# Patient Record
Sex: Male | Born: 1950 | Race: White | Hispanic: No | Marital: Married | Smoking: Never smoker
Health system: Southern US, Community
[De-identification: ages and names within clinical notes are randomized; demographics above are authoritative.]

## PROBLEM LIST (undated history)

## (undated) DIAGNOSIS — N200 Calculus of kidney: Secondary | ICD-10-CM

## (undated) DIAGNOSIS — Z86010 Personal history of colonic polyps: Secondary | ICD-10-CM

## (undated) DIAGNOSIS — C61 Malignant neoplasm of prostate: Secondary | ICD-10-CM

## (undated) DIAGNOSIS — R17 Unspecified jaundice: Secondary | ICD-10-CM

## (undated) DIAGNOSIS — G4730 Sleep apnea, unspecified: Secondary | ICD-10-CM

## (undated) DIAGNOSIS — Z87442 Personal history of urinary calculi: Secondary | ICD-10-CM

## (undated) DIAGNOSIS — R05 Cough: Secondary | ICD-10-CM

## (undated) DIAGNOSIS — R059 Cough, unspecified: Secondary | ICD-10-CM

## (undated) DIAGNOSIS — C439 Malignant melanoma of skin, unspecified: Secondary | ICD-10-CM

## (undated) DIAGNOSIS — G4733 Obstructive sleep apnea (adult) (pediatric): Secondary | ICD-10-CM

## (undated) DIAGNOSIS — E785 Hyperlipidemia, unspecified: Secondary | ICD-10-CM

## (undated) DIAGNOSIS — R5383 Other fatigue: Secondary | ICD-10-CM

## (undated) HISTORY — DX: Cough: R05

## (undated) HISTORY — DX: Malignant neoplasm of prostate: C61

## (undated) HISTORY — PX: LITHOTRIPSY: SUR834

## (undated) HISTORY — DX: Obstructive sleep apnea (adult) (pediatric): G47.33

## (undated) HISTORY — DX: Cough, unspecified: R05.9

## (undated) HISTORY — PX: APPENDECTOMY: SHX54

## (undated) HISTORY — DX: Hyperlipidemia, unspecified: E78.5

## (undated) HISTORY — PX: HAND SURGERY: SHX662

## (undated) HISTORY — DX: Unspecified jaundice: R17

## (undated) HISTORY — DX: Malignant melanoma of skin, unspecified: C43.9

## (undated) HISTORY — DX: Sleep apnea, unspecified: G47.30

## (undated) HISTORY — DX: Other fatigue: R53.83

## (undated) HISTORY — DX: Personal history of colonic polyps: Z86.010

---

## 1998-06-25 ENCOUNTER — Inpatient Hospital Stay (HOSPITAL_COMMUNITY): Admission: EM | Admit: 1998-06-25 | Discharge: 1998-06-27 | Payer: Self-pay | Admitting: Emergency Medicine

## 1998-06-25 ENCOUNTER — Encounter: Payer: Self-pay | Admitting: Emergency Medicine

## 2005-10-01 ENCOUNTER — Inpatient Hospital Stay (HOSPITAL_COMMUNITY): Admission: EM | Admit: 2005-10-01 | Discharge: 2005-10-03 | Payer: Self-pay | Admitting: Emergency Medicine

## 2007-07-29 ENCOUNTER — Ambulatory Visit (HOSPITAL_BASED_OUTPATIENT_CLINIC_OR_DEPARTMENT_OTHER): Admission: RE | Admit: 2007-07-29 | Discharge: 2007-07-29 | Payer: Self-pay | Admitting: Family Medicine

## 2007-08-07 ENCOUNTER — Ambulatory Visit: Payer: Self-pay | Admitting: Internal Medicine

## 2007-09-22 ENCOUNTER — Ambulatory Visit: Payer: Self-pay | Admitting: Internal Medicine

## 2007-09-26 ENCOUNTER — Ambulatory Visit (HOSPITAL_BASED_OUTPATIENT_CLINIC_OR_DEPARTMENT_OTHER): Admission: RE | Admit: 2007-09-26 | Discharge: 2007-09-26 | Payer: Self-pay | Admitting: Family Medicine

## 2007-10-02 ENCOUNTER — Ambulatory Visit: Payer: Self-pay | Admitting: Internal Medicine

## 2007-10-19 ENCOUNTER — Ambulatory Visit: Payer: Self-pay | Admitting: Internal Medicine

## 2007-10-19 HISTORY — PX: COLONOSCOPY: SHX174

## 2007-10-21 ENCOUNTER — Ambulatory Visit (HOSPITAL_COMMUNITY): Admission: RE | Admit: 2007-10-21 | Discharge: 2007-10-21 | Payer: Self-pay | Admitting: Urology

## 2007-10-22 ENCOUNTER — Emergency Department (HOSPITAL_COMMUNITY): Admission: EM | Admit: 2007-10-22 | Discharge: 2007-10-23 | Payer: Self-pay | Admitting: Emergency Medicine

## 2010-06-04 NOTE — Procedures (Signed)
NAME:  Dennis, Villanueva NO.:  [id number]   MEDICAL RECORD NO.:  [id number]          PATIENT TYPE:  OUT   LOCATION:  SLEEP CENTER                 FACILITY:  [facility] [place]   PHYSICIAN:  Clinton D. Maple Hudson, MD, FCCP, FACPDATE OF BIRTH:  03/19/49   DATE OF STUDY:  07/29/2007                            NOCTURNAL POLYSOMNOGRAM   REFERRING PHYSICIAN:  Tammy R. Collins Scotland, M.D.   INDICATION FOR STUDY:  Hypersomnia with sleep apnea.   EPWORTH SLEEPINESS SCORE:  14/24, BMI 29.5, weight 230 pounds, height 74  inches, neck 18 inches.   HOME MEDICATIONS:  Charted and reviewed.   SLEEP ARCHITECTURE:  Total sleep time 378 minutes with sleep efficiency  90.4%.  Stage 1 was 6.3%, stage 2 was 64.7%, stage 3 was absent, REM  28.9% of total sleep time.  Sleep latency 18 minutes, REM latency 63.5  minutes, awake after sleep onset 21.5 minutes.  Arousal index 9.8.  No  bedtime medication reported.   RESPIRATORY DATA:  Apnea/hypopnea index (AHI), 10.1 per hour, 64 events  scored, including 2 obstructive apnea, 1 central apnea and 61 hypopneas.  Events were seen in all sleep positions, somewhat more common while  supine.  REM AHI was 13.7.  There were not enough events in the  available time to quality for CPAP titration by split protocol on this  study night.   OXYGEN DATA:  Moderate snoring with oxygen desaturation to a Nadir of  84%.  Mean oxygen saturation through the study was 93.8% on room air.  A  total of 1.5 minutes was spent with oxygen saturation less than 88%.   CARDIAC DATA:  Sinus rhythm with PVCs.   MOVEMENT/PARASOMNIA:  Frequent limb movement, but little related to  arousal or awakening.  Movement index 37.3.  Bathroom x1.   IMPRESSIONS/RECOMMENDATIONS:  1. Mild obstructive sleep apnea/hypopnea syndrome.  The apnea/hypopnea      index 10.1 per hour.  The events somewhat more common while supine.      Moderate snoring with oxygen desaturation to a Nadir of 84%.  2. There were  insufficient early events, to allow time for CPAP      titration by split protocol on this study night.  Scores in this      range may be addressed with CPAP if conservative measures such as      weight loss, and encouragement to sleep off of flat of back are      insufficient.  Consider return for CPAP titration or evaluate for      alternative therapies as appropriate.  3. Frequent limb movement, most not related to identifiable arousals.      If there is clinical history suggesting      movement-related sleep disturbance at home, then consider directed      therapies such as Requip or Mirapex if clinically appropriate.      Clinton D. Maple Hudson, MD, [facility], FACP  Diplomate, Biomedical engineer of Sleep Medicine  Electronically Signed     CDY/MEDQ  D:  08/07/2007 11:47:45  T:  08/07/2007 12:21:41  Job:  [id number]

## 2010-06-04 NOTE — Procedures (Signed)
NAME:  DEMAR, SHAD NO.:  [id number]   MEDICAL RECORD NO.:  [id number]          PATIENT TYPE:  OUT   LOCATION:  SLEEP CENTER                 [facility]ACILITY:  [facility]   PHYSICIAN:  Clinton D. Maple Hudson, MD, FCCP, FACPDATE OF BIRTH:  02-Mar-1949   DATE OF STUDY:  09/26/2007                            NOCTURNAL POLYSOMNOGRAM   REFERRING PHYSICIAN:  Tammy R. Collins Scotland, M.D.   INDICATION FOR STUDY:  Insomnia with sleep apnea.   EPWORTH SLEEPINESS SCORE:  14/24, BMI 30.2, weight 235 pounds, height 74  inches, neck 18 inches.   MEDICATIONS:  Home medication charted and reviewed.   A baseline diagnostic study on July 29, 2007, revealed an AHI of 10.1  per hour.  CPAP titration is requested.   SLEEP ARCHITECTURE:  Total sleep time 369 minutes with sleep efficiency  90.9%.  Sleep latency 18 minutes.  REM latency 53 minutes.  Awake after  sleep onset 18.5 minutes.  Arousal index 6.0.  Bedtime medication  included Metamucil and Rolaids.   RESPIRATORY DATA:  CPAP titration protocol.  CPAP was titrated to 12  CWP, AHI 0.9 per hour.  He chose a medium ResMed Quattro full face mask  with heated humidifier.   OXYGEN DATA:  Snoring was prevented by CPAP with mean oxygen saturation  through the study 93.9% on room air.   CARDIAC DATA:  Sinus rhythm with rare PVC.   MOVEMENT/PARASOMNIA:  Frequent REM jerks, rarely affecting sleep and  probably insignificant.  Bathroom times one.   IMPRESSIONS-RECOMMENDATIONS:  1. Successful CPAP titration to 12 CWP, AHI 0.9 per hour.  He chose a      medium ResMed Quattro full face mask with heated humidifier.  2. Baseline diagnostic NVSG on July 29, 2007, had revealed an AHI of      10.1 per hour.  3. Limb jerks were noted as described above.  Periodic limb movement      with arousal index was only 0.3 per hour, which is insignificant.      Limb jerks were commented upon at his last study.  His status, once      fitted with home      CPAP, may help  determine whether he would benefit from specific      therapy, such as Requip or Mirapex, if clinically appropriate.      Clinton D. Maple Hudson, MD, [facility], FACP  Diplomate, Biomedical engineer of Sleep Medicine  Electronically Signed     CDY/MEDQ  D:  10/02/2007 09:45:52  T:  10/02/2007 10:19:20  Job:  [id number]

## 2010-06-07 NOTE — Op Note (Signed)
NAME:  Dennis Villanueva, Dennis Villanueva NO.:  [id number]   MEDICAL RECORD NO.:  [id number]          PATIENT TYPE:  INP   LOCATION:  2550                         FACILITY:  [facility]   PHYSICIAN:  Dionne Ano. Gramig III, M.D.DATE OF BIRTH:  10-16-1950   DATE OF PROCEDURE:  09/30/2005  DATE OF DISCHARGE:                                 OPERATIVE REPORT   PREOPERATIVE DIAGNOSIS:  Purulent flexor tenosynovitis, right index finger,  secondary to cat bite.   POSTOPERATIVE DIAGNOSIS:  Purulent flexor tenosynovitis, right index finger,  secondary to cat bite.   PROCEDURE:  Incision and drainage purulent flexor tenosynovitis, right hand  about the flexor sheath region and incision and drainage of a separate  dorsal wound significant for abscess about the dorsal upper aspect of the  hand.   SURGEON:  Dionne Ano. Amanda Pea, M.D.   ASSISTANT:  None.   COMPLICATIONS:  None.   ANESTHESIA:  General.   TOURNIQUET TIME:  Less than an hour.   DRAINS:  Two.   INDICATIONS FOR THE PROCEDURE:  This patient is a 60 year old male who  presents status post cat bite to the right hand 1 day ago.  He has had 2  doses of Augmentin.  He had worsening signs and symptoms with ascending  cellulitis, lymphangitis and marked areas of abnormality about the dorsum  and volar wounds with cat bites were noted.  The patient did not have a  fracture on x-ray.  He did not have obvious instability.  He does have early  Kanavel's sign.  I have discussed with the patient the issues at length.  Discussed the risks and benefits  __________  operative intervention.   OPERATION IN DETAIL:  Patient __________  general anesthesia, laid supine,  prepped and draped in sterile fashion. Betadine scrub and painted about the  right upper extremity. Following this, the patient had I&D of the dorsal  abscess.  Dorsal abscess was I&D through the skin and subcutaneous tissue,  meticulously removed in terms of any prenecrotic areas.   Following this, I  irrigated copiously with fluids and packed the area.  Once this was done,  volar wound was explored.  Incision was made, chevron in nature.  Dissection  was carried down, immediate purulence was encountered.  This was cultured  for aerobic and anaerobic culture.  Following this, the patient then  underwent identification of the radial digital nerve which was intact.  I  then identified the flexor sheath, opened the A1 pulley region and noted  purulence.  With this noted, I then performed a meticulous tenosynovectomy  and irrigation was performed in this region.  He did not have significant  abnormality distally.  There was no excessive tracking.  Following this, I  then irrigated copiously with greater than 2 liters of saline.  Once this  was done, drains were placed.  The wound was closed with 1 loose suture and  we will plan for repeat I&D in 48 hours as well as continued IV antibiotics  in the form of Unasyn.  I have discussed with the patient these issues.  At  the  conclusion of the case, I irrigated once again with the tourniquet down,  making sure he had excellent refill and placement of volar plaster splint  after a sterile dressing was applied.   He will be admitted for IV antibiotics and pain medicine according to his  needs and general postoperative observation.  All questions have been  encouraged and answered.           ______________________________  Dionne Ano. Everlene Other, M.D.     Nash Mantis  D:  10/01/2005  T:  10/01/2005  Job:  [id number]

## 2010-06-07 NOTE — Discharge Summary (Signed)
NAME:  Dennis Villanueva, Dennis Villanueva NO.:  [id number]   MEDICAL RECORD NO.:  [id number]          PATIENT TYPE:  INP   LOCATION:  5159                         FACILITY:  [facility]   PHYSICIAN:  Dionne Ano. Gramig, M.D.DATE OF BIRTH:  07/30/1950   DATE OF ADMISSION:  09/30/2005  DATE OF DISCHARGE:  10/03/2005                                 DISCHARGE SUMMARY   DATE OF ADMISSION:  September 30, 2005   DATE OF DISCHARGE:  October 03, 2005   ADMITTING DIAGNOSES:  1. Right hand index finger purulent flexor tenosynovitis secondary to cat      bite.  2. History of seasonal allergies.   SURGEON:  Dionne Ano. Amanda Pea, M.D.   CONSULTS:  None.   BRIEF HISTORY OF THE PRESENT ILLNESS:  Dennis Villanueva is a very pleasant 60-  year-old gentleman who presented to the emergency room setting on September 30, 2005, secondary to worsening pain and soft tissue swelling about the  right hand, secondary to a prior cat bite.  He was previously seen and  evaluated by Dr. Herb Grays, where he was placed on Augmentin, but  unfortunately had worsening.  He presents to the emergency room for further  evaluation and is seen and evaluated by hand and upper extremity surgeon,  Dionne Ano. Gramig.  He was noted to have a significant amount of erythema  about the palmar aspect of his hand with both dorsal and volar soft tissue  swelling.  He was noted to have positive Kanavel signs with obvious infected  process.  Given the nature of his upper extremity predicament, the decision  was made to proceed with immediate operative intervention.  Preoperative  labs, chest x-ray, and EKG were satisfactory for proceeding with an I&D as  necessary.   HOSPITAL COURSE:  Dennis Villanueva was admitted on September 30, 2005, taken to  the operative suite secondary to a purulent flexor tenosynovitis about the  right hand index finger secondary to cat bite.  He underwent I&D without  difficulties.  Intraoperative cultures were taken.   He was admitted to [facility] with standard postoperative orders, including pain management,  elevation, and IV antibiotics in the form of Unasyn.  The patient did very  well postoperatively.  There was no complications.  Plans were made for a  repeat I&D the following day.  He was taken back to the operative suite and  a repeat I&D was performed with less purulence noted and improvement overall  in his soft tissue condition.  He continued diligent wound care, range of  motion endeavors with therapy, and over the course of the next day, his soft  tissue swelling, erythema, and pain had greatly improved.  Cultures were  negative to date, most likely due to his acute findings, as he was  previously placed on antibiotics prior to his operative intervention.  Given  his overall improvement, decision made to discharge him home.   ASSESSMENT/FINAL DIAGNOSES:  1. Status post incision and drainage x2 secondary to purulent flexor      tenosynovitis, secondary to a cat bite about the right hand and  index      finger.  2. History of seasonal allergies.   CONDITION ON DISCHARGE:  Improved.   ACTIVITY:  He will keep his dressing clean, dry, and intact.  We have  discussed wound care.  He will return Monday at 11 a.m. to the office.  We  will plan for diligent wound care, range of motion, elevation.   DISCHARGE MEDICATIONS:  Include:  1. Augmentin 875 one p.o. b.i.d.  2. Vicodin 1-2 p.o. q.4-6 p.r.n.   He will call [phone] for any questions or concerns.      Karie Chimera, P.A.-C.    ______________________________  Dionne Ano. Amanda Pea, M.D.    BB/MEDQ  D:  10/21/2005  T:  10/21/2005  Job:  [id number]

## 2010-06-07 NOTE — Op Note (Signed)
NAME:  Dennis Villanueva, Dennis Villanueva NO.:  [id number]   MEDICAL RECORD NO.:  [id number]          PATIENT TYPE:  INP   LOCATION:  5159                         FACILITY:  [facility]   PHYSICIAN:  Dionne Ano. Gramig III, M.D.DATE OF BIRTH:  [birth year]   DATE OF PROCEDURE:  DATE OF DISCHARGE:                                 OPERATIVE REPORT   PREOPERATIVE DIAGNOSIS:  Right hand and index finger virulent flexor  tenosynovitis.   POSTOPERATIVE DIAGNOSES:  Right hand and index finger virulent flexor  tenosynovitis.   PROCEDURES:  1. Incision and debridement of virulent flexor tenosynovitis, plus deep      abscess, right index finger and hand.  2. Extensive radical tenosynovectomy of the flexor tendon apparatus with      flexor tendon debridement, right index finger.   SURGEON:  Dionne Ano. Amanda Pea, M.D.   ASSISTANT:  Karie Chimera, P.A.-C.   COMPLICATIONS:  None.   ANESTHESIA:  General.   TOURNIQUET TIME:  Zero.   ESTIMATED BLOOD LOSS:  Less than 30 cc.   INDICATIONS FOR PROCEDURE:  This patient is a 60 year old male, who presents  after a cat bite with virulent flexor tenosynovitis.  He understands and  accepts the risks and benefits of surgery.  This is his second washout.  He  is improved in terms of his wound characteristics, but is not completely at  quiescence at the present time.  I have discussed with him the risks and  benefits of the procedure, alternatives, etc., and all questions have been  encouraged and answered.   OPERATIVE PROCEDURE IN DETAIL:  The patient was identified by myself and  Anesthesia, taken to the operating suite.  In the operating suite, he  underwent a general anesthetic under the direction of Dr. Hart Robinsons.  Following this, the patient had his prior drains removed, and he was prepped  and draped with Betadine Scrub and Paint as necessary.  Once a sterile field  was secured, I then performed an I&D of the deep abscess.  The skin and  subcutaneous tissue were I&D'd aggressively, and the deep portions of the  wound had 3 L placed in them.  Following this, I then performed a radical  tenosynovectomy with release of a portion of the A1 and A2 pulleys to  prevent tendon friction.  Overall, the wound conditions looked improved, but  he did have marked fraying of the FDS tendon, which had to be debrided.  Thus, tenosynovectomy and debridement occurred without difficulty.  The  tendons were intact.  Following this, I then irrigated again with an  additional liter, followed by placement of a TLS drain x2 in the wound and  loose closure with Prolene suture.  He tolerated this well.  Xeroform was  placed, followed by gauze and a volar plaster splint.  He tolerated the  procedure well, and all sponge, needle and instrument counts were reported  as correct.  He will be monitored in the recovery room and will continue as  an inpatient on Unasyn.  We will await his cultures.   I have discussed with him and his wife  specifically the very aggressive  nature of the infection and the fact that this is very serious in terms of  the health of the finger in general, and complications associated with these  types of infections.  I have tried to give them realistic expectations,  etc., and have discussed this at great length with the patient's wife over  the phone, and with the patient at bedside previously.  We will await the  cultures, continue aggressive care for this extremely significant infection.  All questions have been encouraged and answered.           ______________________________  Dionne Ano. Everlene Other, M.D.     Nash Mantis  D:  10/02/2005  T:  10/02/2005  Job:  [id number]

## 2010-10-22 LAB — URINALYSIS, ROUTINE W REFLEX MICROSCOPIC
Bilirubin Urine: NEGATIVE
Glucose, UA: NEGATIVE
Ketones, ur: 15 — AB
Nitrite: NEGATIVE
Protein, ur: NEGATIVE
Specific Gravity, Urine: 1.02
Urobilinogen, UA: 0.2
pH: 5.5

## 2010-10-22 LAB — URINE MICROSCOPIC-ADD ON

## 2011-04-07 ENCOUNTER — Encounter: Payer: Self-pay | Admitting: Pulmonary Disease

## 2011-04-08 ENCOUNTER — Ambulatory Visit (INDEPENDENT_AMBULATORY_CARE_PROVIDER_SITE_OTHER): Payer: 59 | Admitting: Pulmonary Disease

## 2011-04-08 ENCOUNTER — Encounter: Payer: Self-pay | Admitting: Pulmonary Disease

## 2011-04-08 VITALS — BP 128/76 | HR 60 | Temp 97.9°F | Ht 74.0 in | Wt 251.8 lb

## 2011-04-08 DIAGNOSIS — R911 Solitary pulmonary nodule: Secondary | ICD-10-CM

## 2011-04-08 NOTE — Progress Notes (Signed)
  Subjective:    Patient ID: Dennis Villanueva, male    DOB: December 10, 1950, 61 y.o.   MRN: [id number]  HPI The patient is a 61 year old male who I've been asked to see for a pulmonary nodule.  He recently developed cold-like symptoms that persisted, along with a dry hacking cough.  He had a chest x-ray done which showed a left basilar opacity, and right lower lobe atelectasis.  He was treated with antibiotics on 2 different occasions for presumed pneumonia, and feels much improved.  A followup chest x-ray showed improvement, but some persistent density in the left base, and therefore he underwent a CT scan of the chest.  This showed minimal ground glass density deep in both lung bases, as well as mild scarring.  He was also noted to have a 4 mm nodule in his right middle lobe with non-specific characteristics.  There was no mediastinal or hilar lymphadenopathy.  The patient has never smoked, and has no personal history of cancer.  He has never lived in [place] or [place], and has no history of tuberculosis exposure.  However, he has never had a PPD placed.  He is eating well, and has not been losing weight.  Again, he feels that he is getting back to his usual baseline.   Review of Systems  Constitutional: Negative for fever and unexpected weight change.  HENT: Positive for congestion and trouble swallowing. Negative for ear pain, nosebleeds, sore throat, rhinorrhea, sneezing, dental problem, postnasal drip and sinus pressure.   Eyes: Negative for redness and itching.  Respiratory: Positive for cough. Negative for chest tightness, shortness of breath and wheezing.   Cardiovascular: Negative for palpitations and leg swelling.  Gastrointestinal: Negative for nausea and vomiting.  Genitourinary: Negative for dysuria.  Musculoskeletal: Negative for joint swelling.  Skin: Negative for rash.  Neurological: Negative for headaches.  Hematological: Does not bruise/bleed easily.  Psychiatric/Behavioral:  Negative for dysphoric mood. The patient is not nervous/anxious.        Objective:   Physical Exam Constitutional:  Well developed, no acute distress  HENT:  Nares patent without discharge  Oropharynx without exudate, palate and uvula are normal  Eyes:  Perrla, eomi, no scleral icterus  Neck:  No JVD, no TMG  Cardiovascular:  Normal rate,slightly irreg rhythm, no rubs or gallops.  No murmurs        Intact distal pulses  Pulmonary :  Normal breath sounds, no stridor or respiratory distress   No rales, rhonchi, or wheezing  Abdominal:  Soft, nondistended, bowel sounds present.  No tenderness noted.   Musculoskeletal:  No lower extremity edema noted.  Lymph Nodes:  No cervical lymphadenopathy noted  Skin:  No cyanosis noted  Neurologic:  Alert, appropriate, moves all 4 extremities without obvious deficit.         Assessment & Plan:

## 2011-04-08 NOTE — Patient Instructions (Signed)
You are at very low risk for cancer with your history and small size of your nodule.  Standard criteria would recommend a followup scan at one year to re-evaluate.  Please call us in FEB 2014 to schedule scan for march.   Please call if your condition changes before your scan next year.

## 2011-04-08 NOTE — Assessment & Plan Note (Signed)
The patient has a 4 mm right middle lobe nodule noted on a recent CT chest, and is considered to be in a very low risk group overall.  By Fleischner criteria, he would either have no followup vs a one year followup at the most.  After a discussion, the pt and I have agreed to follow this up one more time in one year.  He is to call us to schedule this.  I am not overly concerned about his minimal GG changes in the lower lobes, given his history and returning to his normal baseline.

## 2011-10-03 ENCOUNTER — Other Ambulatory Visit: Payer: Self-pay | Admitting: Physician Assistant

## 2011-10-03 DIAGNOSIS — R1310 Dysphagia, unspecified: Secondary | ICD-10-CM

## 2011-10-08 ENCOUNTER — Ambulatory Visit
Admission: RE | Admit: 2011-10-08 | Discharge: 2011-10-08 | Disposition: A | Discharge status: Home / Self Care | Payer: BC Managed Care – PPO | Source: Ambulatory Visit | Attending: Physician Assistant | Admitting: Physician Assistant

## 2011-10-08 DIAGNOSIS — R1310 Dysphagia, unspecified: Secondary | ICD-10-CM

## 2012-09-22 ENCOUNTER — Encounter: Payer: Self-pay | Admitting: Internal Medicine

## 2012-09-22 DIAGNOSIS — Z860100 Personal history of colon polyps, unspecified: Secondary | ICD-10-CM | POA: Insufficient documentation

## 2012-09-22 HISTORY — DX: Personal history of colonic polyps: Z86.010

## 2012-09-30 ENCOUNTER — Encounter: Payer: Self-pay | Admitting: Internal Medicine

## 2014-05-27 ENCOUNTER — Encounter (HOSPITAL_COMMUNITY): Payer: Self-pay | Admitting: *Deleted

## 2014-05-27 ENCOUNTER — Emergency Department (HOSPITAL_COMMUNITY): Payer: PPO

## 2014-05-27 ENCOUNTER — Emergency Department (HOSPITAL_COMMUNITY)
Admission: EM | Admit: 2014-05-27 | Discharge: 2014-05-27 | Disposition: A | Discharge status: Home / Self Care | Payer: PPO | Attending: Emergency Medicine | Admitting: Emergency Medicine

## 2014-05-27 DIAGNOSIS — Z9981 Dependence on supplemental oxygen: Secondary | ICD-10-CM | POA: Diagnosis not present

## 2014-05-27 DIAGNOSIS — Z79899 Other long term (current) drug therapy: Secondary | ICD-10-CM | POA: Insufficient documentation

## 2014-05-27 DIAGNOSIS — R630 Anorexia: Secondary | ICD-10-CM | POA: Insufficient documentation

## 2014-05-27 DIAGNOSIS — Z8639 Personal history of other endocrine, nutritional and metabolic disease: Secondary | ICD-10-CM | POA: Insufficient documentation

## 2014-05-27 DIAGNOSIS — N201 Calculus of ureter: Secondary | ICD-10-CM | POA: Diagnosis not present

## 2014-05-27 DIAGNOSIS — R1030 Lower abdominal pain, unspecified: Secondary | ICD-10-CM | POA: Diagnosis present

## 2014-05-27 DIAGNOSIS — Z86010 Personal history of colonic polyps: Secondary | ICD-10-CM | POA: Insufficient documentation

## 2014-05-27 DIAGNOSIS — Z7982 Long term (current) use of aspirin: Secondary | ICD-10-CM | POA: Insufficient documentation

## 2014-05-27 DIAGNOSIS — G4733 Obstructive sleep apnea (adult) (pediatric): Secondary | ICD-10-CM | POA: Insufficient documentation

## 2014-05-27 LAB — URINALYSIS, ROUTINE W REFLEX MICROSCOPIC
Bilirubin Urine: NEGATIVE
Glucose, UA: NEGATIVE mg/dL
Ketones, ur: NEGATIVE mg/dL
LEUKOCYTES UA: NEGATIVE
NITRITE: NEGATIVE
PH: 5 (ref 5.0–8.0)
Protein, ur: NEGATIVE mg/dL
SPECIFIC GRAVITY, URINE: 1.023 (ref 1.005–1.030)
UROBILINOGEN UA: 0.2 mg/dL (ref 0.0–1.0)

## 2014-05-27 LAB — CBC WITH DIFFERENTIAL/PLATELET
BASOS ABS: 0 10*3/uL (ref 0.0–0.1)
BASOS PCT: 0 % (ref 0–1)
Eosinophils Absolute: 0.1 10*3/uL (ref 0.0–0.7)
Eosinophils Relative: 1 % (ref 0–5)
HCT: 45.8 % (ref 39.0–52.0)
Hemoglobin: 15.6 g/dL (ref 13.0–17.0)
Lymphocytes Relative: 7 % — ABNORMAL LOW (ref 12–46)
Lymphs Abs: 0.7 10*3/uL (ref 0.7–4.0)
MCH: 32.1 pg (ref 26.0–34.0)
MCHC: 34.1 g/dL (ref 30.0–36.0)
MCV: 94.2 fL (ref 78.0–100.0)
Monocytes Absolute: 0.5 10*3/uL (ref 0.1–1.0)
Monocytes Relative: 6 % (ref 3–12)
NEUTROS ABS: 7.9 10*3/uL — AB (ref 1.7–7.7)
NEUTROS PCT: 86 % — AB (ref 43–77)
PLATELETS: 196 10*3/uL (ref 150–400)
RBC: 4.86 MIL/uL (ref 4.22–5.81)
RDW: 12.3 % (ref 11.5–15.5)
WBC: 9.2 10*3/uL (ref 4.0–10.5)

## 2014-05-27 LAB — URINE MICROSCOPIC-ADD ON

## 2014-05-27 LAB — I-STAT CHEM 8, ED
BUN: 18 mg/dL (ref 6–20)
CALCIUM ION: 1.22 mmol/L (ref 1.13–1.30)
Chloride: 103 mmol/L (ref 101–111)
Creatinine, Ser: 1.2 mg/dL (ref 0.61–1.24)
GLUCOSE: 122 mg/dL — AB (ref 70–99)
HEMATOCRIT: 47 % (ref 39.0–52.0)
HEMOGLOBIN: 16 g/dL (ref 13.0–17.0)
POTASSIUM: 4.8 mmol/L (ref 3.5–5.1)
Sodium: 141 mmol/L (ref 135–145)
TCO2: 25 mmol/L (ref 0–100)

## 2014-05-27 MED ORDER — SODIUM CHLORIDE 0.9 % IV SOLN
20.0000 mL | INTRAVENOUS | Status: DC
  Administered 2014-05-27: 50 mL via INTRAVENOUS

## 2014-05-27 MED ORDER — IOHEXOL 300 MG/ML  SOLN
100.0000 mL | Freq: Once | INTRAMUSCULAR | Status: AC | PRN
  Administered 2014-05-27: 100 mL via INTRAVENOUS

## 2014-05-27 MED ORDER — IOHEXOL 300 MG/ML  SOLN
25.0000 mL | Freq: Once | INTRAMUSCULAR | Status: AC | PRN
  Administered 2014-05-27: 25 mL via ORAL

## 2014-05-27 MED ORDER — ONDANSETRON HCL 4 MG/2ML IJ SOLN
4.0000 mg | Freq: Once | INTRAMUSCULAR | Status: AC
  Administered 2014-05-27: 4 mg via INTRAVENOUS
  Filled 2014-05-27: qty 2

## 2014-05-27 MED ORDER — KETOROLAC TROMETHAMINE 30 MG/ML IJ SOLN
30.0000 mg | Freq: Once | INTRAMUSCULAR | Status: AC
  Administered 2014-05-27: 30 mg via INTRAVENOUS
  Filled 2014-05-27: qty 1

## 2014-05-27 MED ORDER — OXYCODONE-ACETAMINOPHEN 5-325 MG PO TABS: 1.0000 | ORAL_TABLET | Freq: Four times a day (QID) | ORAL | Status: DC | PRN

## 2014-05-27 MED ORDER — SODIUM CHLORIDE 0.9 % IV BOLUS (SEPSIS)
500.0000 mL | Freq: Once | INTRAVENOUS | Status: AC
  Administered 2014-05-27: 500 mL via INTRAVENOUS

## 2014-05-27 MED ORDER — MORPHINE SULFATE 4 MG/ML IJ SOLN
4.0000 mg | Freq: Once | INTRAMUSCULAR | Status: AC
  Administered 2014-05-27: 4 mg via INTRAVENOUS
  Filled 2014-05-27: qty 1

## 2014-05-27 MED ORDER — FENTANYL CITRATE (PF) 100 MCG/2ML IJ SOLN
50.0000 ug | Freq: Once | INTRAMUSCULAR | Status: AC
  Administered 2014-05-27: 50 ug via INTRAVENOUS
  Filled 2014-05-27: qty 2

## 2014-05-27 NOTE — Discharge Instructions (Signed)

## 2014-05-27 NOTE — ED Notes (Signed)
Patient transported to CT 

## 2014-05-27 NOTE — ED Notes (Signed)
Pt finished ct contrast, notified CT.

## 2014-05-27 NOTE — ED Provider Notes (Signed)
CSN: [id number]     Arrival date & time 05/27/14  2025 History   First MD Initiated Contact with Patient 05/27/14 0801     Chief Complaint  Patient presents with  . Abdominal Pain     (Consider location/radiation/quality/duration/timing/severity/associated sxs/prior Treatment) Patient is a 64 y.o. male presenting with abdominal pain. The history is provided by the patient.  Abdominal Pain Associated symptoms: nausea and vomiting   Associated symptoms: no chest pain, no diarrhea and no shortness of breath    patient with lower abdominal pain. Began yesterday. It is dull. Has had nausea with slight vomiting. Somewhat decreased appetite. No fevers. No dysuria. He had a small bowel movement today. Pain is a couple inches below his belly button for the patient. He has not had pains like this before. He states that in the past these to struggle with gas but states this feels different.  Past Medical History  Diagnosis Date  . OSA (obstructive sleep apnea)     wears cpap  . Cough   . Fatigue   . Allergic rhinitis   . Hyperlipidemia   . Personal history of colonic adenoma 09/22/2012   Past Surgical History  Procedure Laterality Date  . Appendecomty  1978  . Hand surgery      Right   Family History  Problem Relation Age of Onset  . Emphysema Father   . Emphysema Mother   . Allergies Mother   . Asthma Mother   . Heart disease Father   . Cancer Father     bladder   History  Substance Use Topics  . Smoking status: Never Smoker   . Smokeless tobacco: Not on file  . Alcohol Use: Yes     Comment: glas of wine daily  occasional 2 drinks rarely 3 drinks    Review of Systems  Constitutional: Negative for activity change and appetite change.  Eyes: Negative for pain.  Respiratory: Negative for chest tightness and shortness of breath.   Cardiovascular: Negative for chest pain and leg swelling.  Gastrointestinal: Positive for nausea, vomiting and abdominal pain. Negative for diarrhea.   Genitourinary: Negative for flank pain.  Musculoskeletal: Negative for back pain and neck stiffness.  Skin: Negative for rash.  Neurological: Negative for weakness, numbness and headaches.  Psychiatric/Behavioral: Negative for behavioral problems.      Allergies  Review of patient's allergies indicates no known allergies.  Home Medications   Prior to Admission medications   Medication Sig Start Date End Date Taking? Authorizing Provider  aspirin 81 MG tablet Take 81 mg by mouth daily.   Yes Historical Provider, MD  aspirin-acetaminophen-caffeine (EXCEDRIN MIGRAINE) [phone] MG per tablet Take 1 tablet by mouth every 6 (six) hours as needed for headache.   Yes Historical Provider, MD  fexofenadine (ALLEGRA) 180 MG tablet Take 180 mg by mouth daily.   Yes Historical Provider, MD  Multiple Vitamin (MULTIVITAMIN) tablet Take 1 tablet by mouth daily.   Yes Historical Provider, MD  omega-3 acid ethyl esters (LOVAZA) 1 G capsule Take 4 capsules by mouth daily.    Yes Historical Provider, MD  Red Yeast Rice Extract (RED YEAST RICE PO) Take 1 tablet by mouth daily.   Yes Historical Provider, MD  oxyCODONE-acetaminophen (PERCOCET/ROXICET) 5-325 MG per tablet Take 1-2 tablets by mouth every 6 (six) hours as needed for severe pain. 05/27/14   Davonna Belling, MD   BP 115/68 mmHg  Pulse 50  Temp(Src) 97.5 F (36.4 C) (Oral)  Resp 16  SpO2  97% Physical Exam  Constitutional: He is oriented to person, place, and time. He appears well-developed and well-nourished.  HENT:  Head: Normocephalic and atraumatic.  Eyes: EOM are normal. Pupils are equal, round, and reactive to light.  Neck: Normal range of motion. Neck supple.  Cardiovascular: Normal rate, regular rhythm and normal heart sounds.   No murmur heard. Pulmonary/Chest: Effort normal and breath sounds normal.  Abdominal: Soft. He exhibits no distension and no mass. There is tenderness. There is no rebound and no guarding.  Lower  abdominal tenderness near the midline. No rebound or guarding.  Musculoskeletal: Normal range of motion. He exhibits no edema.  Neurological: He is alert and oriented to person, place, and time. No cranial nerve deficit.  Skin: Skin is warm and dry.  Psychiatric: He has a normal mood and affect.  Nursing note and vitals reviewed.   ED Course  Procedures (including critical care time) Labs Review Labs Reviewed  URINALYSIS, ROUTINE W REFLEX MICROSCOPIC - Abnormal; Notable for the following:    APPearance CLOUDY (*)    Hgb urine dipstick LARGE (*)    All other components within normal limits  CBC WITH DIFFERENTIAL/PLATELET - Abnormal; Notable for the following:    Neutrophils Relative % 86 (*)    Neutro Abs 7.9 (*)    Lymphocytes Relative 7 (*)    All other components within normal limits  I-STAT CHEM 8, ED - Abnormal; Notable for the following:    Glucose, Bld 122 (*)    All other components within normal limits  URINE MICROSCOPIC-ADD ON    Imaging Review Ct Abdomen Pelvis W Contrast  05/27/2014   CLINICAL DATA:  Severe mid umbilical area abdominal pain x 2 days w/ nausea & vomiting and some constipation for several weeks Began last night and has worsened today Hx of Liver Mets Hx of colonic adenoma Hx appendectomy No hx of CA  EXAM: CT ABDOMEN AND PELVIS WITH CONTRAST  TECHNIQUE: Multidetector CT imaging of the abdomen and pelvis was performed using the standard protocol following bolus administration of intravenous contrast.  CONTRAST:  170mL OMNIPAQUE IOHEXOL 300 MG/ML  SOLN  COMPARISON:  None.  FINDINGS: 1-2 mm stone lies in the distal left ureter just above the ureterovesicular junction. This causes mild left ureteral dilation and mild to moderate left hydronephrosis.  No other ureteral stones. Right ureter is normal course and caliber. There is mild dilation of right renal pelvis without calyx dilation.  There are intrarenal stones on the left a large, 19 mm stone in the lower pole  with an adjacent 3 mm stone and a 2 mm stone in the midpole. No intrarenal stone on the right. Tiny low-density lesion along the anterior margin of the midpole of the left kidney, likely a cyst. No other renal masses or lesions. Bladder is unremarkable.  2-3 mm nodule in the right middle lobe anteriorly. Mild lung base subsegmental atelectasis. Heart normal in size.  Small low-density lesion in the lateral segment of the left liver lobe likely a cyst. Focal fat adjacent to the falciform ligament. No other liver abnormality.  Spleen, gallbladder, pancreas, adrenal glands:  Unremarkable.  No pathologically enlarged lymph nodes.  No ascites.  Numerous left colon diverticula. No diverticulitis. Remainder of the colon is unremarkable. Normal small bowel. No appendix visualized.  Degenerative changes noted of the visualized spine. No osteoblastic or osteolytic lesions.  IMPRESSION: 1. 1-2 mm stone in the distal left ureter causing left hydronephrosis and hydroureter. 2. No other acute  finding. 3. Nonobstructing stones in the left kidney. 4. Dilated right renal pelvis, without ureteral dilation. This is likely a chronic finding. 5. 2-3 mm right middle lobe nodule. If the patient is at high risk for bronchogenic carcinoma, follow-up chest CT at 1 year is recommended. If the patient is at low risk, no follow-up is needed. This recommendation follows the consensus statement: Guidelines for Management of Small Pulmonary Nodules Detected on CT Scans: A Statement from the [employer] as published in Radiology 2005; 237:395-400. 6. Multiple left colon diverticula.   Electronically Signed   By: Lajean Manes M.D.   On: 05/27/2014 10:10     EKG Interpretation None      MDM   Final diagnoses:  Left ureteral stone    Patient with abdominal pain. Left ureteral stone. No infection. Told of pulmonary nodule. Has a urologist. Will d/c.   Davonna Belling, MD 05/27/14 [phone]

## 2014-05-27 NOTE — ED Notes (Addendum)
To ED for eval of lower abd pain, pinpoint pain "an inch or two below belly button" per pt. Vomiting one time this am. Last normal BM was yesterday. Pt traveled from [place] yesterday and last meal was at 2pm yesterday ([place]). Denies passing gas. States he has taken otc laxatives without relief. Urination is without difficulty per pt. abd soft,but tender. No pulsating masses noted

## 2014-05-27 NOTE — ED Notes (Signed)
MD at bedside. 

## 2014-05-27 NOTE — ED Notes (Signed)
Pt. States still in a lot of pain. Asking for pain meds. Nurse notified.

## 2014-06-30 IMAGING — RF DG ESOPHAGUS
14 of 17 series · 19 of 24 positions shown · non-contrast
Comparison: None.

CLINICAL DATA: Dysphagia, fullness in the neck

ESOPHOGRAM/BARIUM SWALLOW
TECHNIQUE: Combined double contrast and single contrast
examination performed using effervescent crystals, thick barium
liquid, and thin barium liquid.
Fluoroscopy time:  1.9 minutes.

[Series 1: run · 1 of 1 slices shown (1 of 14)]
[im 1/1]
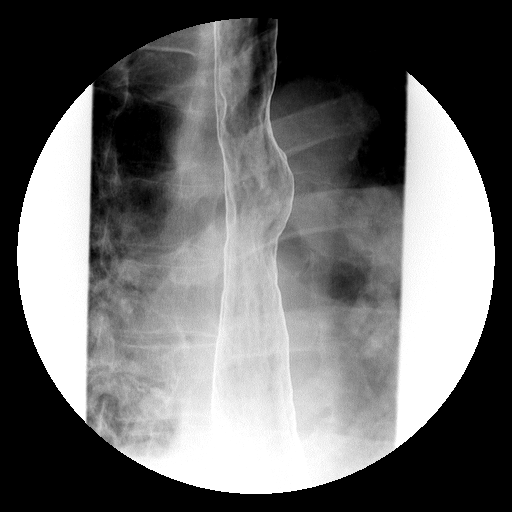

[Series 2: run · 1 of 1 slices shown (2 of 14)]
[im 1/1]
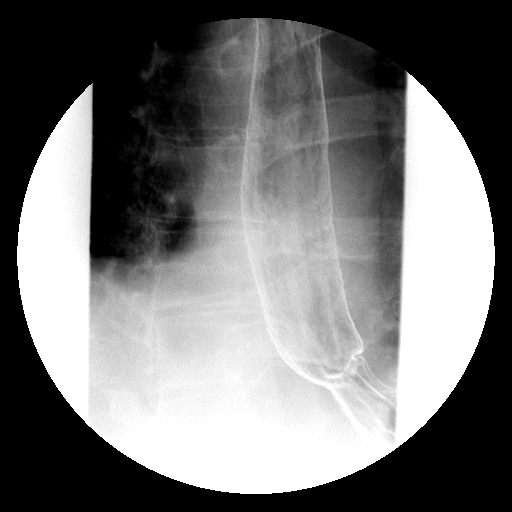

[Series 4: run · 4 of 8 slices shown (3 of 14)]
[im 1/8]
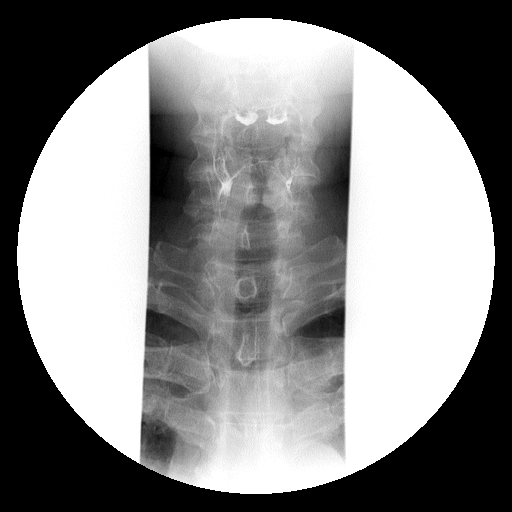
[im 3/8]
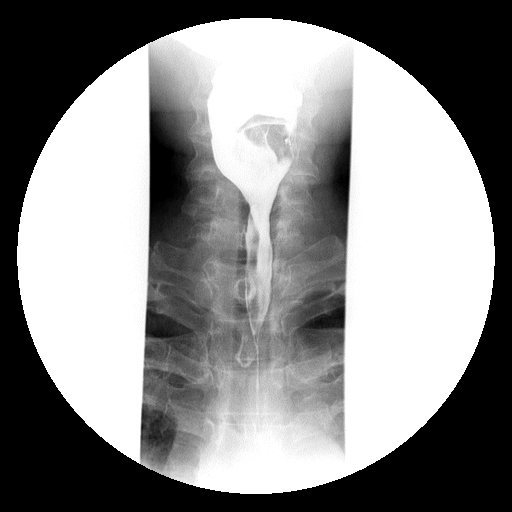
[im 5/8]
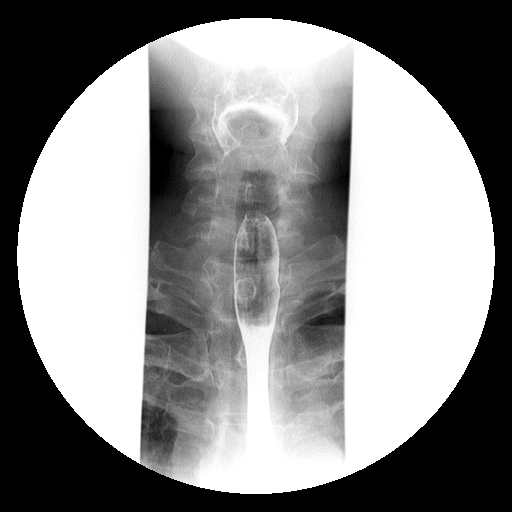
[im 8/8]
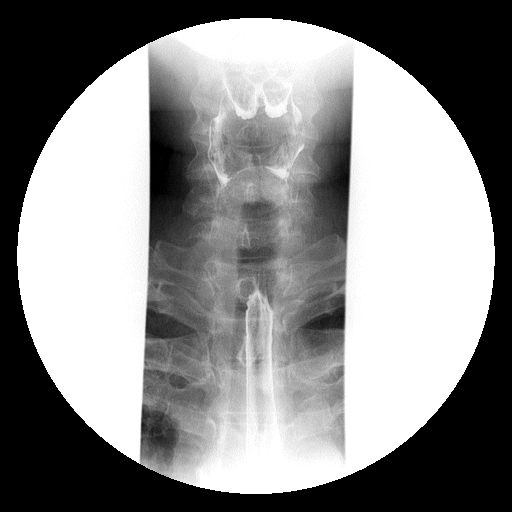

[Series 5: run · 3 of 8 slices shown (4 of 14)]
[im 2/8]
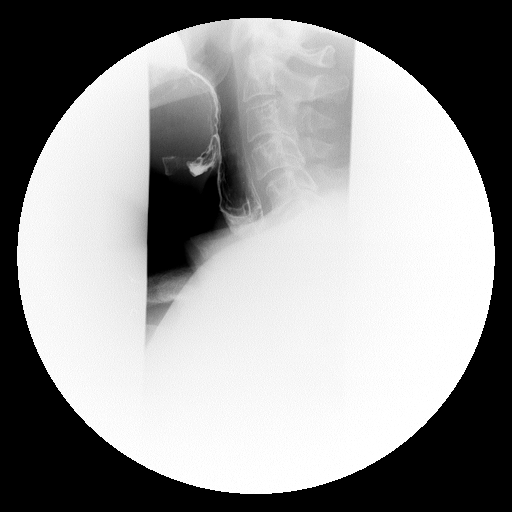
[im 4/8]
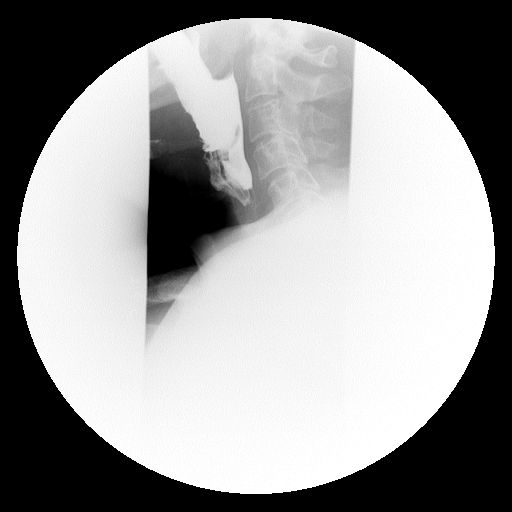
[im 6/8]
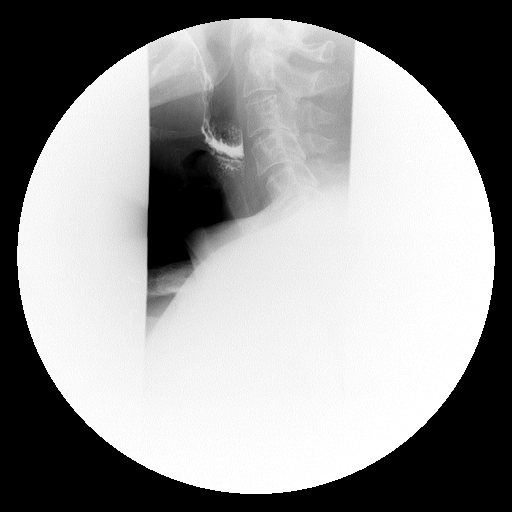

[Series 6: run · 1 of 1 slices shown (5 of 14)]
[im 1/1]
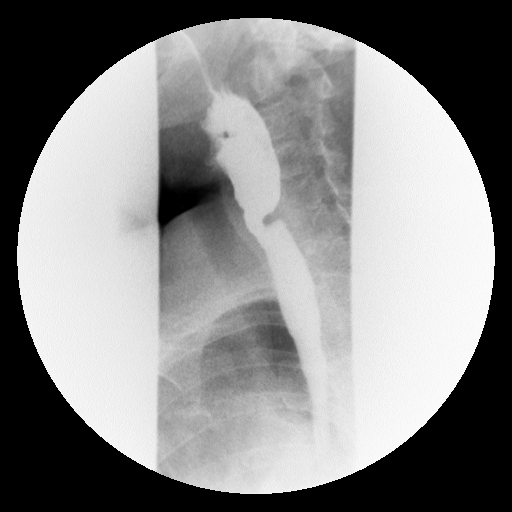

[Series 8: run · 1 of 1 slices shown (6 of 14)]
[im 1/1]
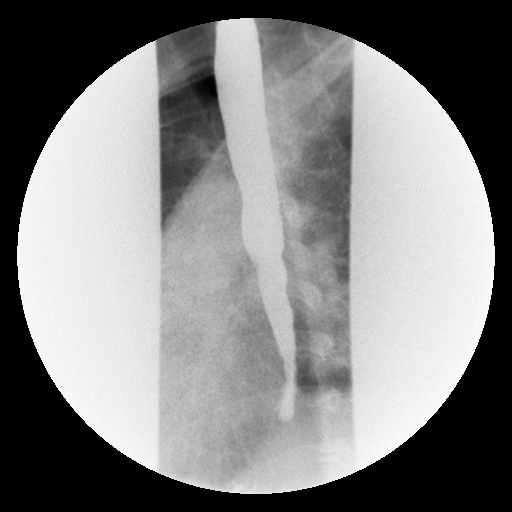

[Series 9: run · 1 of 1 slices shown (7 of 14)]
[im 1/1]
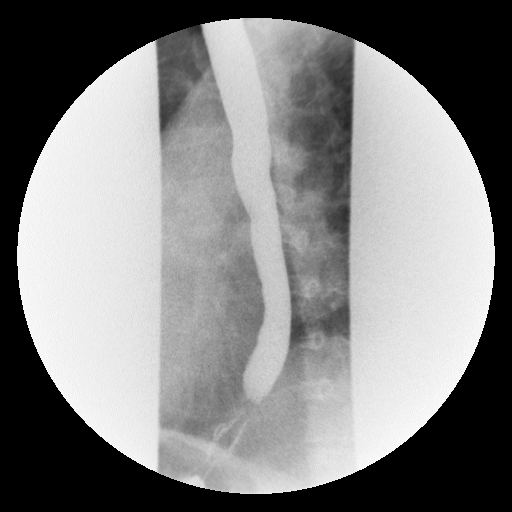

[Series 10: run · 1 of 1 slices shown (8 of 14)]
[im 1/1]
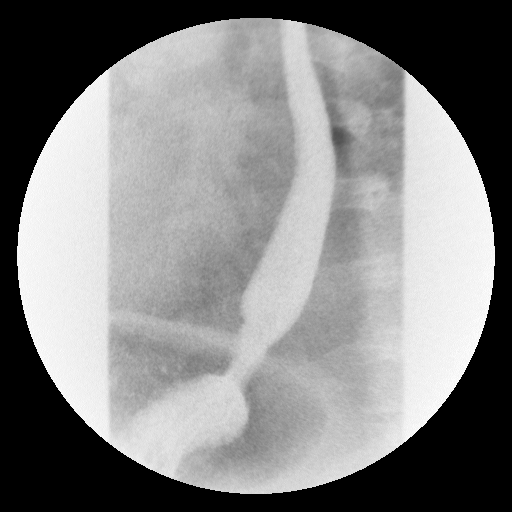

[Series 12: run · 1 of 1 slices shown (9 of 14)]
[im 1/1]
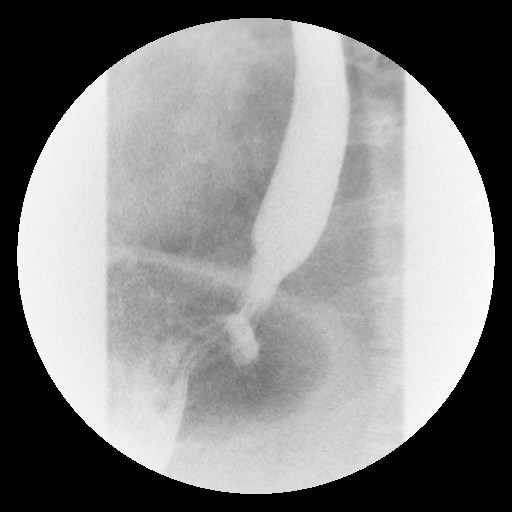

[Series 13: run · 1 of 1 slices shown (10 of 14)]
[im 1/1]
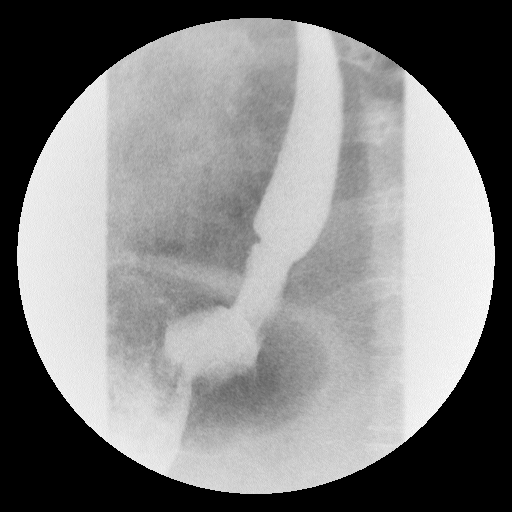

[Series 14: run · 1 of 1 slices shown (11 of 14)]
[im 1/1]
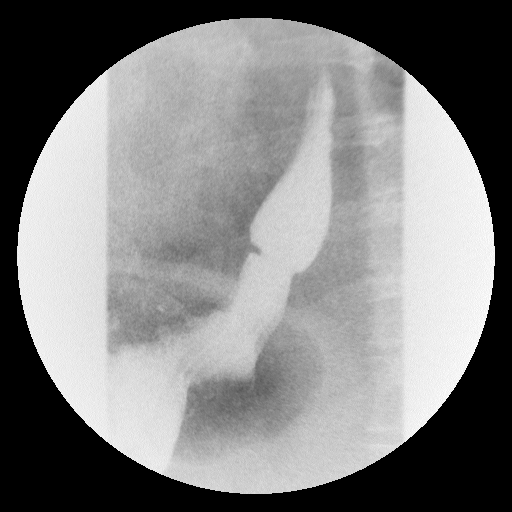

[Series 15: run · 1 of 1 slices shown (12 of 14)]
[im 1/1]
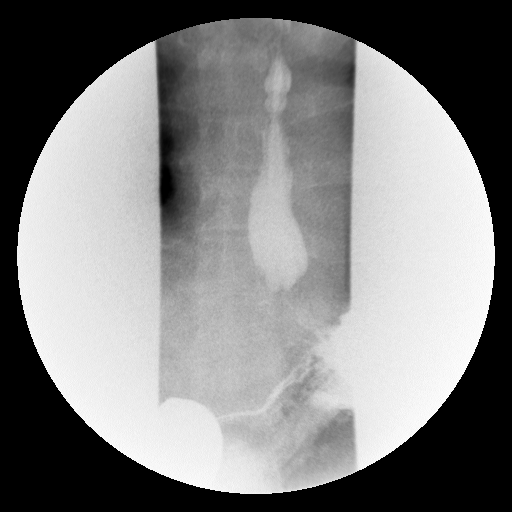

[Series 17: run · 1 of 1 slices shown (13 of 14)]
[im 1/1]
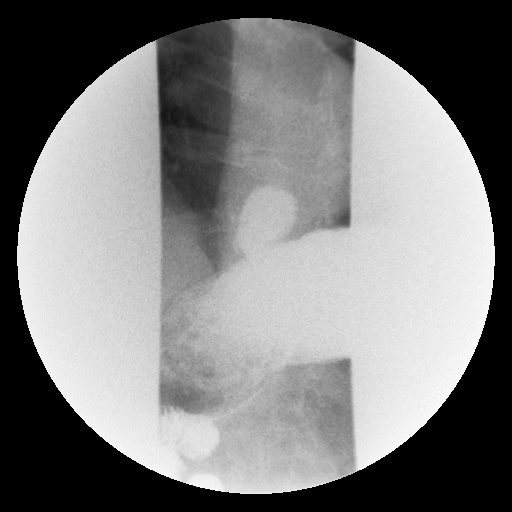

[Series 18: run · 1 of 1 slices shown (14 of 14)]
[im 1/1]
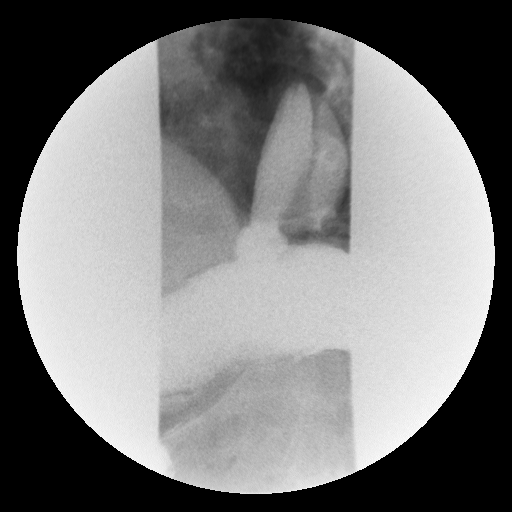

[19 of 24 positions shown; findings below may reference images not displayed]

FINDINGS: A double contrast study was performed.  The mucosa of
the esophagus is unremarkable.  Rapid sequence spot films of the
cervical esophagus show a normal swallowing mechanism.  There is a
prominent cricopharyngeus muscle noted.  This may account for the
full feeling and neck experienced by the patient.  Mild tertiary
contractions are noted distally.  There is a small hiatal hernia
present.  Mild to moderate gastroesophageal reflux is demonstrated.
A barium coated tablet was given at the end of study which did pass
into the stomach without delay.
IMPRESSION: 1.  Small hiatal hernia with mild gastroesophageal reflux.
2.  Barium coated tablet passes into the stomach without delay.
3.  Prominent cricopharyngeus muscle.

## 2015-03-15 DIAGNOSIS — Z125 Encounter for screening for malignant neoplasm of prostate: Secondary | ICD-10-CM | POA: Diagnosis not present

## 2015-03-15 DIAGNOSIS — J309 Allergic rhinitis, unspecified: Secondary | ICD-10-CM | POA: Diagnosis not present

## 2015-03-15 DIAGNOSIS — Z0000 Encounter for general adult medical examination without abnormal findings: Secondary | ICD-10-CM | POA: Diagnosis not present

## 2015-03-15 DIAGNOSIS — E785 Hyperlipidemia, unspecified: Secondary | ICD-10-CM | POA: Diagnosis not present

## 2015-05-29 ENCOUNTER — Encounter: Payer: Self-pay | Admitting: Internal Medicine

## 2015-09-10 DIAGNOSIS — H524 Presbyopia: Secondary | ICD-10-CM | POA: Diagnosis not present

## 2016-03-24 DIAGNOSIS — J309 Allergic rhinitis, unspecified: Secondary | ICD-10-CM | POA: Diagnosis not present

## 2016-03-24 DIAGNOSIS — Z0000 Encounter for general adult medical examination without abnormal findings: Secondary | ICD-10-CM | POA: Diagnosis not present

## 2016-03-24 DIAGNOSIS — Z23 Encounter for immunization: Secondary | ICD-10-CM | POA: Diagnosis not present

## 2016-03-24 DIAGNOSIS — Z125 Encounter for screening for malignant neoplasm of prostate: Secondary | ICD-10-CM | POA: Diagnosis not present

## 2016-03-24 DIAGNOSIS — E785 Hyperlipidemia, unspecified: Secondary | ICD-10-CM | POA: Diagnosis not present

## 2016-04-01 ENCOUNTER — Telehealth: Payer: Self-pay | Admitting: Internal Medicine

## 2016-04-01 NOTE — Telephone Encounter (Signed)
Nita notified of the recall change from 2014.  I sent a copy of the letter to her

## 2016-06-30 DIAGNOSIS — E785 Hyperlipidemia, unspecified: Secondary | ICD-10-CM | POA: Diagnosis not present

## 2017-03-27 DIAGNOSIS — E785 Hyperlipidemia, unspecified: Secondary | ICD-10-CM | POA: Diagnosis not present

## 2017-03-27 DIAGNOSIS — Z0000 Encounter for general adult medical examination without abnormal findings: Secondary | ICD-10-CM | POA: Diagnosis not present

## 2017-03-27 DIAGNOSIS — Z23 Encounter for immunization: Secondary | ICD-10-CM | POA: Diagnosis not present

## 2017-03-27 DIAGNOSIS — Z125 Encounter for screening for malignant neoplasm of prostate: Secondary | ICD-10-CM | POA: Diagnosis not present

## 2017-04-01 DIAGNOSIS — R972 Elevated prostate specific antigen [PSA]: Secondary | ICD-10-CM | POA: Diagnosis not present

## 2017-04-01 DIAGNOSIS — N200 Calculus of kidney: Secondary | ICD-10-CM | POA: Diagnosis not present

## 2017-05-04 DIAGNOSIS — R69 Illness, unspecified: Secondary | ICD-10-CM | POA: Diagnosis not present

## 2017-05-04 DIAGNOSIS — R972 Elevated prostate specific antigen [PSA]: Secondary | ICD-10-CM | POA: Diagnosis not present

## 2017-09-18 ENCOUNTER — Other Ambulatory Visit: Payer: Self-pay

## 2017-09-18 ENCOUNTER — Emergency Department (HOSPITAL_BASED_OUTPATIENT_CLINIC_OR_DEPARTMENT_OTHER)
Admission: EM | Admit: 2017-09-18 | Discharge: 2017-09-18 | Disposition: A | Discharge status: Home / Self Care | Payer: Medicare HMO | Attending: Emergency Medicine | Admitting: Emergency Medicine

## 2017-09-18 ENCOUNTER — Emergency Department (HOSPITAL_BASED_OUTPATIENT_CLINIC_OR_DEPARTMENT_OTHER): Payer: Medicare HMO

## 2017-09-18 ENCOUNTER — Encounter (HOSPITAL_BASED_OUTPATIENT_CLINIC_OR_DEPARTMENT_OTHER): Payer: Self-pay

## 2017-09-18 DIAGNOSIS — Z7982 Long term (current) use of aspirin: Secondary | ICD-10-CM | POA: Diagnosis not present

## 2017-09-18 DIAGNOSIS — Z79899 Other long term (current) drug therapy: Secondary | ICD-10-CM | POA: Insufficient documentation

## 2017-09-18 DIAGNOSIS — K5730 Diverticulosis of large intestine without perforation or abscess without bleeding: Secondary | ICD-10-CM | POA: Diagnosis not present

## 2017-09-18 DIAGNOSIS — R809 Proteinuria, unspecified: Secondary | ICD-10-CM | POA: Diagnosis not present

## 2017-09-18 DIAGNOSIS — R310 Gross hematuria: Secondary | ICD-10-CM

## 2017-09-18 DIAGNOSIS — R319 Hematuria, unspecified: Secondary | ICD-10-CM | POA: Diagnosis not present

## 2017-09-18 HISTORY — DX: Calculus of kidney: N20.0

## 2017-09-18 LAB — CBC WITH DIFFERENTIAL/PLATELET
BASOS PCT: 1 %
Basophils Absolute: 0.1 10*3/uL (ref 0.0–0.1)
Eosinophils Absolute: 0.3 10*3/uL (ref 0.0–0.7)
Eosinophils Relative: 5 %
HEMATOCRIT: 43 % (ref 39.0–52.0)
HEMOGLOBIN: 14.7 g/dL (ref 13.0–17.0)
Lymphocytes Relative: 23 %
Lymphs Abs: 1.4 10*3/uL (ref 0.7–4.0)
MCH: 32.3 pg (ref 26.0–34.0)
MCHC: 34.2 g/dL (ref 30.0–36.0)
MCV: 94.5 fL (ref 78.0–100.0)
MONO ABS: 0.6 10*3/uL (ref 0.1–1.0)
Monocytes Relative: 10 %
NEUTROS ABS: 3.8 10*3/uL (ref 1.7–7.7)
NEUTROS PCT: 61 %
Platelets: 144 10*3/uL — ABNORMAL LOW (ref 150–400)
RBC: 4.55 MIL/uL (ref 4.22–5.81)
RDW: 12.1 % (ref 11.5–15.5)
WBC: 6.2 10*3/uL (ref 4.0–10.5)

## 2017-09-18 LAB — BASIC METABOLIC PANEL
ANION GAP: 7 (ref 5–15)
BUN: 16 mg/dL (ref 8–23)
CALCIUM: 8.6 mg/dL — AB (ref 8.9–10.3)
CO2: 28 mmol/L (ref 22–32)
Chloride: 105 mmol/L (ref 98–111)
Creatinine, Ser: 0.93 mg/dL (ref 0.61–1.24)
GFR calc non Af Amer: >60 mL/min (ref >60)
Glucose, Bld: 111 mg/dL — ABNORMAL HIGH (ref 70–99)
POTASSIUM: 4 mmol/L (ref 3.5–5.1)
SODIUM: 140 mmol/L (ref 135–145)

## 2017-09-18 LAB — URINALYSIS, ROUTINE W REFLEX MICROSCOPIC
Glucose, UA: NEGATIVE mg/dL
KETONES UR: NEGATIVE mg/dL
LEUKOCYTES UA: NEGATIVE
NITRITE: NEGATIVE
PROTEIN: 30 mg/dL — AB
Specific Gravity, Urine: 1.02 (ref 1.005–1.030)
pH: 5 (ref 5.0–8.0)

## 2017-09-18 LAB — URINALYSIS, MICROSCOPIC (REFLEX): RBC / HPF: >50 RBC/hpf (ref 0–5)

## 2017-09-18 NOTE — ED Notes (Signed)
Pt/family verbalized understanding of discharge instructions.   

## 2017-09-18 NOTE — ED Provider Notes (Signed)
[facility] EMERGENCY DEPARTMENT Provider Note   CSN: [id number] Arrival date & time: 09/18/17  1520     History   Chief Complaint Chief Complaint  Patient presents with  . Hematuria    HPI Dennis Villanueva is a 67 y.o. male.  He is complaining of some hematuria since yesterday.  States his urine was orange in color.  No clots.  No fevers no chills no nausea no vomiting no abdominal pain.  He had a little bit of left-sided low back discomfort.  No trauma.  He is a history of kidney stones in the past but he said when he those were giving him symptoms there is a significant amount of pain.  He does have a urologist at [facility] but they were unable to see him until Tuesday.  He went to [facility] walk-in today and he had a urinalysis with blood and protein.  The history is provided by the patient.  Hematuria  This is a new problem. The current episode started yesterday. Episode frequency: when urinates. The problem has been gradually improving. Pertinent negatives include no chest pain, no abdominal pain, no headaches and no shortness of breath. Nothing aggravates the symptoms. Nothing relieves the symptoms. He has tried nothing for the symptoms. The treatment provided no relief.    Past Medical History:  Diagnosis Date  . Allergic rhinitis   . Cough   . Fatigue   . Hyperlipidemia   . Kidney stone   . OSA (obstructive sleep apnea)    wears cpap  . Personal history of colonic adenoma 09/22/2012    Patient Active Problem List   Diagnosis Date Noted  . Personal history of colonic adenoma 09/22/2012  . Pulmonary nodule 04/08/2011    Past Surgical History:  Procedure Laterality Date  . APPENDECTOMY    . HAND SURGERY     Right  . LITHOTRIPSY          Home Medications    Prior to Admission medications   Medication Sig Start Date End Date Taking? Authorizing Provider  aspirin 81 MG tablet Take 81 mg by mouth daily.    [provider]    aspirin-acetaminophen-caffeine (EXCEDRIN MIGRAINE) [phone] MG per tablet Take 1 tablet by mouth every 6 (six) hours as needed for headache.    [provider]  fexofenadine (ALLEGRA) 180 MG tablet Take 180 mg by mouth daily.    [provider]  Multiple Vitamin (MULTIVITAMIN) tablet Take 1 tablet by mouth daily.    [provider]  omega-3 acid ethyl esters (LOVAZA) 1 G capsule Take 4 capsules by mouth daily.     [provider]  oxyCODONE-acetaminophen (PERCOCET/ROXICET) 5-325 MG per tablet Take 1-2 tablets by mouth every 6 (six) hours as needed for severe pain. 05/27/14   Davonna Belling, MD  Red Yeast Rice Extract (RED YEAST RICE PO) Take 1 tablet by mouth daily.    [provider]    Family History Family History  Problem Relation Age of Onset  . Emphysema Father   . Heart disease Father   . Cancer Father        bladder  . Emphysema Mother   . Allergies Mother   . Asthma Mother     Social History Social History   Tobacco Use  . Smoking status: Never Smoker  Substance Use Topics  . Alcohol use: Yes    Comment: glas of wine daily  occasional 2 drinks rarely 3 drinks  . Drug use:  No     Allergies   Patient has no known allergies.   Review of Systems Review of Systems  Constitutional: Negative for fever.  HENT: Negative for sore throat.   Eyes: Negative for visual disturbance.  Respiratory: Negative for shortness of breath.   Cardiovascular: Negative for chest pain.  Gastrointestinal: Negative for abdominal pain.  Genitourinary: Positive for hematuria. Negative for dysuria.  Musculoskeletal: Positive for back pain (mild left).  Skin: Negative for rash.  Neurological: Negative for headaches.     Physical Exam Updated Vital Signs BP 140/77 (BP Location: Left Arm)   Pulse (!) 56   Temp 98.2 F (36.8 C) (Oral)   Resp 18   Ht 6\' 2"  (1.88 m)   Wt 108.9 kg   SpO2 97%   BMI 30.81 kg/m   Physical Exam   Constitutional: He is oriented to person, place, and time. He appears well-developed and well-nourished.  HENT:  Head: Normocephalic and atraumatic.  Eyes: Conjunctivae are normal.  Neck: Neck supple.  Cardiovascular: Normal rate, regular rhythm, normal heart sounds and intact distal pulses.  Pulmonary/Chest: Effort normal and breath sounds normal. He has no wheezes. He has no rales.  Abdominal: Soft. He exhibits no mass. There is no tenderness. There is no guarding and no CVA tenderness.  Musculoskeletal: He exhibits no tenderness or deformity.  Neurological: He is alert and oriented to person, place, and time. Gait normal. GCS eye subscore is 4. GCS verbal subscore is 5. GCS motor subscore is 6.  Skin: Skin is warm and dry.  Psychiatric: He has a normal mood and affect.  Nursing note and vitals reviewed.    ED Treatments / Results  Labs (all labs ordered are listed, but only abnormal results are displayed) Labs Reviewed  URINALYSIS, ROUTINE W REFLEX MICROSCOPIC - Abnormal; Notable for the following components:      Result Value   Color, Urine BROWN (*)    APPearance CLOUDY (*)    Hgb urine dipstick LARGE (*)    Bilirubin Urine SMALL (*)    Protein, ur 30 (*)    All other components within normal limits  BASIC METABOLIC PANEL - Abnormal; Notable for the following components:   Glucose, Bld 111 (*)    Calcium 8.6 (*)    All other components within normal limits  CBC WITH DIFFERENTIAL/PLATELET - Abnormal; Notable for the following components:   Platelets 144 (*)    All other components within normal limits  URINALYSIS, MICROSCOPIC (REFLEX) - Abnormal; Notable for the following components:   Bacteria, UA FEW (*)    All other components within normal limits    EKG None  Radiology Ct Renal Stone Study  Result Date: 09/18/2017 CLINICAL DATA:  Painless hematuria, hx renal stones, symptoms x 2 days Last renal stone 3 yrs ago EXAM: CT ABDOMEN AND PELVIS WITHOUT CONTRAST  TECHNIQUE: Multidetector CT imaging of the abdomen and pelvis was performed following the standard protocol without IV contrast. COMPARISON:  05/27/2014 FINDINGS: Lower chest: Nodule previously identified in the RIGHT middle lobe is not imaged or may have resolved. There is bibasilar atelectasis. Heart size is normal. Hepatobiliary: No focal liver abnormality is seen. No radiopaque gallstones, biliary dilatation, or pericholecystic inflammatory changes. Pancreas: Unremarkable. No pancreatic ductal dilatation or surrounding inflammatory changes. Spleen: Normal in size without focal abnormality. Adrenals/Urinary Tract: The adrenal glands are normal in appearance. A calculus in the LEFT renal pelvis measures 11 x 19 millimeters. The distal ureter is unremarkable. There is extrarenal pelvis  on the RIGHT, stable in appearance. No suspicious renal mass. Urinary bladder is unremarkable. Visualized portion of the urethra is negative. Stomach/Bowel: The stomach and small bowel loops are normal in appearance. There are numerous diverticula in the colon. No acute diverticulitis. Status post appendectomy. Vascular/Lymphatic: There is atherosclerotic calcification of the abdominal aorta. No aneurysm. No retroperitoneal or mesenteric adenopathy. Reproductive: There are calcifications in the prostate gland. Prostate appears enlarged. Seminal vesicles are normal in appearance. Other: There is a small amount of fat within the LEFT inguinal ring. Abdominal wall is otherwise unremarkable. No ascites. Musculoskeletal: Mild degenerative changes are seen in the LOWER thoracic and lumbar spine. IMPRESSION: 1.  No evidence for acute  abnormality. 2. Stable appearance of calculus in the LEFT renal pelvis. No distal urinary tract obstruction. 3. Stable appearance of RIGHT extrarenal pelvis. 4. Significant colonic diverticulosis without acute diverticulitis. 5.  Aortic atherosclerosis.  (ICD10-I70.0) 6. Prostatic enlargement/prostatic  calcifications. 7. Fat within the LEFT inguinal ring.  No obstructing hernia. Electronically Signed   By: Nolon Nations M.D.   On: 09/18/2017 16:38    Procedures Procedures (including critical care time)  Medications Ordered in ED Medications - No data to display   Initial Impression / Assessment and Plan / ED Course  I have reviewed the triage vital signs and the nursing notes.  Pertinent labs & imaging results that were available during my care of the patient were reviewed by me and considered in my medical decision making (see chart for details).  Clinical Course as of Sep 20 951  Fri Sep 18, 7237  3839 67 year old male not on anticoagulation here with mostly painless hematuria since yesterday.  He does have a little bit of low back pain on the left but he says is very unlike his prior kidney stone attacks.  We put him in for urinalysis blood work and a CT renal study.   [MB]    Clinical Course User Index [MB] Hayden Rasmussen, MD     Final Clinical Impressions(s) / ED Diagnoses   Final diagnoses:  Gross hematuria    ED Discharge Orders    None       Hayden Rasmussen, MD 09/19/17 [phone]

## 2017-09-18 NOTE — ED Triage Notes (Signed)
Sent from [facility] for hematuria since yesterday with mild left lower back pain, hx of kidney stones

## 2017-09-18 NOTE — Discharge Instructions (Addendum)
You were evaluated in the emergency department for blood in your urine.  You had lab work that showed that your kidney function was normal.  You also did not have an obvious sign of urine infection.  You had a CAT scan that showed you have a large kidney stone on the left that is in a similar position to prior studies.  This may be the cause of your hematuria.  It will be important for you to keep your urology appointment on Tuesday.  IMPRESSION:  1.  No evidence for acute  abnormality.  2. Stable appearance of calculus in the LEFT renal pelvis. No distal  urinary tract obstruction.  3. Stable appearance of RIGHT extrarenal pelvis.  4. Significant colonic diverticulosis without acute diverticulitis.  5.  Aortic atherosclerosis.  (ICD10-I70.0)  6. Prostatic enlargement/prostatic calcifications.  7. Fat within the LEFT inguinal ring.  No obstructing hernia.

## 2017-09-18 NOTE — ED Notes (Signed)
ED Provider at bedside. 

## 2017-09-22 ENCOUNTER — Other Ambulatory Visit: Payer: Self-pay | Admitting: Urology

## 2017-09-22 DIAGNOSIS — N200 Calculus of kidney: Secondary | ICD-10-CM | POA: Diagnosis not present

## 2017-09-22 DIAGNOSIS — R310 Gross hematuria: Secondary | ICD-10-CM | POA: Diagnosis not present

## 2017-09-23 ENCOUNTER — Other Ambulatory Visit: Payer: Self-pay | Admitting: Urology

## 2017-09-28 ENCOUNTER — Other Ambulatory Visit: Payer: Self-pay

## 2017-09-28 ENCOUNTER — Ambulatory Visit (HOSPITAL_COMMUNITY): Payer: Medicare HMO

## 2017-09-28 ENCOUNTER — Encounter (HOSPITAL_COMMUNITY): Payer: Self-pay | Admitting: *Deleted

## 2017-09-28 ENCOUNTER — Ambulatory Visit (HOSPITAL_COMMUNITY)
Admission: RE | Admit: 2017-09-28 | Discharge: 2017-09-28 | Disposition: A | Discharge status: Home / Self Care | Payer: Medicare HMO | Source: Ambulatory Visit | Attending: Urology | Admitting: Urology

## 2017-09-28 ENCOUNTER — Encounter (HOSPITAL_COMMUNITY)
Admission: RE | Disposition: A | Discharge status: Home / Self Care | Payer: Self-pay | Source: Ambulatory Visit | Attending: Urology

## 2017-09-28 DIAGNOSIS — Z79899 Other long term (current) drug therapy: Secondary | ICD-10-CM | POA: Diagnosis not present

## 2017-09-28 DIAGNOSIS — G4730 Sleep apnea, unspecified: Secondary | ICD-10-CM | POA: Insufficient documentation

## 2017-09-28 DIAGNOSIS — R310 Gross hematuria: Secondary | ICD-10-CM | POA: Insufficient documentation

## 2017-09-28 DIAGNOSIS — N200 Calculus of kidney: Secondary | ICD-10-CM | POA: Insufficient documentation

## 2017-09-28 DIAGNOSIS — Z7982 Long term (current) use of aspirin: Secondary | ICD-10-CM | POA: Diagnosis not present

## 2017-09-28 DIAGNOSIS — K219 Gastro-esophageal reflux disease without esophagitis: Secondary | ICD-10-CM | POA: Diagnosis not present

## 2017-09-28 DIAGNOSIS — Z01818 Encounter for other preprocedural examination: Secondary | ICD-10-CM | POA: Diagnosis not present

## 2017-09-28 DIAGNOSIS — E7800 Pure hypercholesterolemia, unspecified: Secondary | ICD-10-CM | POA: Insufficient documentation

## 2017-09-28 HISTORY — PX: EXTRACORPOREAL SHOCK WAVE LITHOTRIPSY: SHX1557

## 2017-09-28 HISTORY — DX: Personal history of urinary calculi: Z87.442

## 2017-09-28 SURGERY — LITHOTRIPSY, ESWL
Anesthesia: LOCAL | Laterality: Left

## 2017-09-28 MED ORDER — DIAZEPAM 5 MG PO TABS
10.0000 mg | ORAL_TABLET | ORAL | Status: AC
  Administered 2017-09-28: 10 mg via ORAL
  Filled 2017-09-28: qty 2

## 2017-09-28 MED ORDER — CIPROFLOXACIN HCL 500 MG PO TABS
500.0000 mg | ORAL_TABLET | ORAL | Status: AC
  Administered 2017-09-28: 500 mg via ORAL
  Filled 2017-09-28: qty 1

## 2017-09-28 MED ORDER — DIPHENHYDRAMINE HCL 25 MG PO CAPS
25.0000 mg | ORAL_CAPSULE | ORAL | Status: AC
  Administered 2017-09-28: 25 mg via ORAL
  Filled 2017-09-28: qty 1

## 2017-09-28 MED ORDER — SODIUM CHLORIDE 0.9 % IV SOLN
INTRAVENOUS | Status: DC
  Administered 2017-09-28: 15:00:00 via INTRAVENOUS

## 2017-09-28 NOTE — Op Note (Signed)
See [facility] OP note for details.  This is a staged procedure.

## 2017-09-28 NOTE — H&P (Signed)
CC: I have blood in my urine.  HPI: Dennis Villanueva is a 67 year-old male established patient who is here for blood in the urine.  He did see the blood in his urine. He first noticed the symptoms approximately 09/17/2017. He has seen blood clots.   He does not have a burning sensation when he urinates. He is not currently having trouble urinating.   He is not having pain.   His last U/S or CT Scan was 09/18/2017.   Dennis Villanueva returns today with the onset last week of gross hematuria. He was seen at [facility] and sent to the ER where a CT was done that showed that his lower pole stone is now in the left renal pelvis and measures 11x14mm. He passed a few clots on Sunday but none since. The blood has decreased. He has had no pain or other voiding complaints other than frequency with increased fluid intake.      CC: AUA Questions Scoring.  HPI:     AUA Symptom Score: Less than 20% of the time he has the sensation of not emptying his bladder completely when finished urinating. He never has to urinate again less that two hours after he has finished urinating. Less than 50% of the time he has to start and stop again several times when he urinates. Less than 20% of the time he finds it difficult to postpone urination. 50% of the time he has a weak urinary stream. Less than 50% of the time he has to push or strain to begin urination. He has to get up to urinate 1 time from the time he goes to bed until the time he gets up in the morning.   Calculated AUA Symptom Score: 10    ALLERGIES: No Allergies    MEDICATIONS: Aspirin 81 mg tablet,chewable Oral  Excedrin Extra Strength  Fish Oil CAPS Oral  Motrin 800 mg tablet 0 Oral  Multi-Vitamin TABS Oral  Red Yeast Rice CAPS Oral     Notes: statin   GU PSH: ESWL - 2009      PSH Notes: Lithotripsy, Appendectomy, Hand Surgery   NON-GU PSH: Appendectomy - 2009 Extensive Hand Surgery    GU PMH: Elevated PSA, He has had a 4 point PSA rise in  the past year but his exam is benign. I am going to repeat the PSA and if it is going down I will get another in a month but if it remains elevated I will have him return for a biopsy. I have reviewed the risks of bleeding, infection and voiding difficulty. - 04/01/2017 Renal calculus, Calculus of left kidney - 2016, Kidney stone on right side, - 2014 Ureteral calculus, Calculus of ureter - 2016 Spermatocele of epididymis, Unspec, Spermatocele - 2016 History of urolithiasis, Nephrolithiasis - 2014    NON-GU PMH: Encounter for general adult medical examination without abnormal findings, Encounter for preventive health examination - 2016 Personal history of other diseases of the digestive system, History of esophageal reflux - 2015 Personal history of other diseases of the nervous system and sense organs, History of sleep apnea - 2014 Hypercholesterolemia Sleep Apnea    FAMILY HISTORY: Acute Myocardial Infarction - Father Bladder Cancer - Father Blood In Urine - Father Death of family member - Runs In Family Dementia - Runs In Family Diabetes - Father Heart problem - Brother Kidney Stones - Runs In Family malignant neoplasm of kidney - Runs In Family nephrolithiasis - Father renal failure - Runs In Family  SOCIAL HISTORY: Marital Status: Married Preferred Language: English; Race: White Current Smoking Status: Patient has never smoked.   Tobacco Use Assessment Completed: Used Tobacco in last 30 days? Uses smokeless tobacco. Drinks 1 drink per day.  Drinks 2 caffeinated drinks per day. Patient's occupation Brewing technologist.     Notes: Caffeine Use, Alcohol Use, Never a smoker, Marital History - Currently Married, Occupation: Manufacturing engineer.    REVIEW OF SYSTEMS:    GU Review Male:   Patient reports hard to postpone urination. Patient denies frequent urination, burning/ pain with urination, get up at night to urinate, leakage of urine, stream starts and stops, trouble starting your  stream, have to strain to urinate , erection problems, and penile pain.  Gastrointestinal (Upper):   Patient denies nausea, vomiting, and indigestion/ heartburn.  Gastrointestinal (Lower):   Patient denies constipation and diarrhea.  Constitutional:   Patient denies fever, night sweats, weight loss, and fatigue.  Skin:   Patient denies skin rash/ lesion and itching.  Eyes:   Patient denies blurred vision and double vision.  Ears/ Nose/ Throat:   Patient denies sore throat and sinus problems.  Hematologic/Lymphatic:   Patient denies swollen glands and easy bruising.  Cardiovascular:   Patient denies leg swelling and chest pains.  Respiratory:   Patient denies cough and shortness of breath.  Endocrine:   Patient denies excessive thirst.  Musculoskeletal:   Patient denies back pain and joint pain.  Neurological:   Patient denies headaches and dizziness.  Psychologic:   Patient denies depression and anxiety.   VITAL SIGNS:      09/22/2017 09:07 AM  Weight 235 lb / 106.59 kg  Height 74 in / 187.96 cm  BP 126/80 mmHg  Pulse 60 /min  Temperature 98.2 F / 36.7 C  BMI 30.2 kg/m   MULTI-SYSTEM PHYSICAL EXAMINATION:    Constitutional: Well-nourished. No physical deformities. Normally developed. Good grooming.  Respiratory: No labored breathing, no use of accessory muscles. CTA  Cardiovascular: Normal temperature, RRR without murmur.  Lymphatic: No enlargement of neck, axillae, groin.     PAST DATA REVIEWED:  Source Of History:  Patient  Records Review:   AUA Symptom Score  Urine Test Review:   Urinalysis  X-Ray Review: C.T. Stone Protocol: Reviewed Films. Reviewed Report. Discussed With Patient.     05/04/17 04/01/17  PSA  Total PSA 1.58 ng/mL 2.76 ng/mL    PROCEDURES: None   ASSESSMENT:      ICD-10 Details  1 GU:   Gross hematuria - R31.0 He has gross hematuria from an 11x72mm left renal pelvic stone. I discussed options including ESWL, URS and PCNL and will get him set up for  ESWL next Monday. I reviewed the risks of ESWL including bleeding, infection, injury to the kidney or adjacent structures, failure to fragment the stone, need for ancillary procedures, thrombotic events, cardiac arrhythmias and sedation complications. He was given scripts for oxycodone and tamsulosin.  2   Renal calculus - N20.0 Worsening   PLAN:            Medications New Meds: Tamsulosin Hcl 0.4 mg capsule 1 capsule PO Daily   #30  1 Refill(s)  Oxycodone-Acetaminophen 5 mg-325 mg tablet 1 tablet PO Q 6 H   #15  0 Refill(s)            Schedule Return Visit/Planned Activity: ASAP - Schedule Surgery             Note: ESWL on 9/9.  Document Letter(s):  Created for Patient: Clinical Summary         Notes:   CC: Dr. London Pepper.         Next Appointment:      Next Appointment: 11/02/2017 03:00 PM    Appointment Type: Office Visit Established Patient    Location: [facility] [phone]    Provider: Irine Seal, M.D.    Reason for Visit: 6 mth ov

## 2017-09-28 NOTE — Discharge Instructions (Signed)
Lithotripsy, Care After °This sheet gives you information about how to care for yourself after your procedure. Your health care provider may also give you more specific instructions. If you have problems or questions, contact your health care provider. °What can I expect after the procedure? °After the procedure, it is common to have: °· Some blood in your urine. This should only last for a few days. °· Soreness in your back, sides, or upper abdomen for a few days. °· Blotches or bruises on your back where the pressure wave entered the skin. °· Pain, discomfort, or nausea when pieces (fragments) of the kidney stone move through the tube that carries urine from the kidney to the bladder (ureter). Stone fragments may pass soon after the procedure, but they may continue to pass for up to 4-8 weeks. °? If you have severe pain or nausea, contact your health care provider. This may be caused by a large stone that was not broken up, and this may mean that you need more treatment. °· Some pain or discomfort during urination. °· Some pain or discomfort in the lower abdomen or (in men) at the base of the penis. ° °Follow these instructions at home: °Medicines °· Take over-the-counter and prescription medicines only as told by your health care provider. °· If you were prescribed an antibiotic medicine, take it as told by your health care provider. Do not stop taking the antibiotic even if you start to feel better. °· Do not drive for 24 hours if you were given a medicine to help you relax (sedative). °· Do not drive or use heavy machinery while taking prescription pain medicine. °Eating and drinking °· Drink enough water and fluids to keep your urine clear or pale yellow. This helps any remaining pieces of the stone to pass. It can also help prevent new stones from forming. °· Eat plenty of fresh fruits and vegetables. °· Follow instructions from your health care provider about eating and drinking restrictions. You may be  instructed: °? To reduce how much salt (sodium) you eat or drink. Check ingredients and nutrition facts on packaged foods and beverages. °? To reduce how much meat you eat. °· Eat the recommended amount of calcium for your age and gender. Ask your health care provider how much calcium you should have. °General instructions °· Get plenty of rest. °· Most people can resume normal activities 1-2 days after the procedure. Ask your health care provider what activities are safe for you. °· If directed, strain all urine through the strainer that was provided by your health care provider. °? Keep all fragments for your health care provider to see. Any stones that are found may be sent to a medical lab for examination. The stone may be as small as a grain of salt. °· Keep all follow-up visits as told by your health care provider. This is important. °Contact a health care provider if: °· You have pain that is severe or does not get better with medicine. °· You have nausea that is severe or does not go away. °· You have blood in your urine longer than your health care provider told you to expect. °· You have more blood in your urine. °· You have pain during urination that does not go away. °· You urinate more frequently than usual and this does not go away. °· You develop a rash or any other possible signs of an allergic reaction. °Get help right away if: °· You have severe pain in   your back, sides, or upper abdomen. °· You have severe pain while urinating. °· Your urine is very dark red. °· You have blood in your stool (feces). °· You cannot pass any urine at all. °· You feel a strong urge to urinate after emptying your bladder. °· You have a fever or chills. °· You develop shortness of breath, difficulty breathing, or chest pain. °· You have severe nausea that leads to persistent vomiting. °· You faint. °Summary °· After this procedure, it is common to have some pain, discomfort, or nausea when pieces (fragments) of the  kidney stone move through the tube that carries urine from the kidney to the bladder (ureter). If this pain or nausea is severe, however, you should contact your health care provider. °· Most people can resume normal activities 1-2 days after the procedure. Ask your health care provider what activities are safe for you. °· Drink enough water and fluids to keep your urine clear or pale yellow. This helps any remaining pieces of the stone to pass, and it can help prevent new stones from forming. °· If directed, strain your urine and keep all fragments for your health care provider to see. Fragments or stones may be as small as a grain of salt. °· Get help right away if you have severe pain in your back, sides, or upper abdomen or have severe pain while urinating. °This information is not intended to replace advice given to you by your health care provider. Make sure you discuss any questions you have with your health care provider. °Document Released: 01/26/2007 Document Revised: 11/28/2015 Document Reviewed: 11/28/2015 °Elsevier Interactive Patient Education © 2018 [employer] ° °

## 2017-09-28 NOTE — Interval H&P Note (Signed)
History and Physical Interval Note:   No change in stone location.   09/28/2017 4:27 PM  Dennis Villanueva  has presented today for surgery, with the diagnosis of LEFT RENAL PELVIC STONE  The various methods of treatment have been discussed with the patient and family. After consideration of risks, benefits and other options for treatment, the patient has consented to  Procedure(s): LEFT EXTRACORPOREAL SHOCK WAVE LITHOTRIPSY (ESWL) (Left) as a surgical intervention .  The patient's history has been reviewed, patient examined, no change in status, stable for surgery.  I have reviewed the patient's chart and labs.  Questions were answered to the patient's satisfaction.     Irine Seal

## 2017-10-05 DIAGNOSIS — N200 Calculus of kidney: Secondary | ICD-10-CM | POA: Diagnosis not present

## 2017-10-12 ENCOUNTER — Encounter (HOSPITAL_COMMUNITY): Payer: Self-pay | Admitting: Urology

## 2017-10-20 DIAGNOSIS — N200 Calculus of kidney: Secondary | ICD-10-CM | POA: Diagnosis not present

## 2017-10-24 DIAGNOSIS — R69 Illness, unspecified: Secondary | ICD-10-CM | POA: Diagnosis not present

## 2017-10-28 DIAGNOSIS — R972 Elevated prostate specific antigen [PSA]: Secondary | ICD-10-CM | POA: Diagnosis not present

## 2017-11-02 DIAGNOSIS — N200 Calculus of kidney: Secondary | ICD-10-CM | POA: Diagnosis not present

## 2017-11-10 DIAGNOSIS — N200 Calculus of kidney: Secondary | ICD-10-CM | POA: Diagnosis not present

## 2017-11-24 DIAGNOSIS — Z87442 Personal history of urinary calculi: Secondary | ICD-10-CM | POA: Diagnosis not present

## 2017-11-24 DIAGNOSIS — R82993 Hyperuricosuria: Secondary | ICD-10-CM | POA: Diagnosis not present

## 2017-12-07 DIAGNOSIS — R69 Illness, unspecified: Secondary | ICD-10-CM | POA: Diagnosis not present

## 2017-12-08 DIAGNOSIS — Z87442 Personal history of urinary calculi: Secondary | ICD-10-CM | POA: Diagnosis not present

## 2017-12-28 DIAGNOSIS — Z87442 Personal history of urinary calculi: Secondary | ICD-10-CM | POA: Diagnosis not present

## 2018-01-25 ENCOUNTER — Encounter: Payer: Self-pay | Admitting: Internal Medicine

## 2018-02-10 ENCOUNTER — Encounter: Payer: Self-pay | Admitting: Internal Medicine

## 2018-02-12 ENCOUNTER — Encounter: Payer: Self-pay | Admitting: Internal Medicine

## 2018-02-12 ENCOUNTER — Ambulatory Visit (AMBULATORY_SURGERY_CENTER): Payer: Self-pay | Admitting: *Deleted

## 2018-02-12 VITALS — Ht 74.0 in | Wt 247.0 lb

## 2018-02-12 DIAGNOSIS — Z1211 Encounter for screening for malignant neoplasm of colon: Secondary | ICD-10-CM

## 2018-02-12 NOTE — Progress Notes (Signed)
Patient denies any allergies to eggs or soy. Patient denies any problems with anesthesia/sedation. Patient denies any oxygen use at home. Patient denies taking any diet/weight loss medications or blood thinners.  

## 2018-02-25 ENCOUNTER — Encounter: Payer: Self-pay | Admitting: Internal Medicine

## 2018-02-25 ENCOUNTER — Ambulatory Visit (AMBULATORY_SURGERY_CENTER): Payer: Managed Care, Other (non HMO) | Admitting: Internal Medicine

## 2018-02-25 VITALS — BP 108/69 | HR 50 | Temp 97.3°F | Resp 15 | Ht 74.0 in | Wt 251.0 lb

## 2018-02-25 DIAGNOSIS — D122 Benign neoplasm of ascending colon: Secondary | ICD-10-CM | POA: Diagnosis not present

## 2018-02-25 DIAGNOSIS — G4733 Obstructive sleep apnea (adult) (pediatric): Secondary | ICD-10-CM | POA: Diagnosis not present

## 2018-02-25 DIAGNOSIS — E7800 Pure hypercholesterolemia, unspecified: Secondary | ICD-10-CM | POA: Diagnosis not present

## 2018-02-25 DIAGNOSIS — Z86010 Personal history of colonic polyps: Secondary | ICD-10-CM | POA: Diagnosis not present

## 2018-02-25 MED ORDER — SODIUM CHLORIDE 0.9 % IV SOLN: 500.0000 mL | Freq: Once | INTRAVENOUS | Status: AC

## 2018-02-25 NOTE — Progress Notes (Signed)
Called to room to assist during endoscopic procedure.  Patient ID and intended procedure confirmed with present staff. Received instructions for my participation in the procedure from the performing physician.  

## 2018-02-25 NOTE — Op Note (Signed)
[place] Patient Name: Dennis Villanueva Procedure Date: 02/25/2018 1:45 PM MRN: [id number] Endoscopist: Gatha Mayer , MD Age: 68 Referring MD:  Date of Birth: March 20, 1950 Gender: Male Account #: [id number] Procedure:                Colonoscopy Indications:              Surveillance: Personal history of adenomatous                            polyps on last colonoscopy > 5 years ago Medicines:                Propofol per Anesthesia, Monitored Anesthesia Care Procedure:                Pre-Anesthesia Assessment:                           - Prior to the procedure, a History and Physical                            was performed, and patient medications and                            allergies were reviewed. The patient's tolerance of                            previous anesthesia was also reviewed. The risks                            and benefits of the procedure and the sedation                            options and risks were discussed with the patient.                            All questions were answered, and informed consent                            was obtained. Prior Anticoagulants: The patient has                            taken no previous anticoagulant or antiplatelet                            agents. ASA Grade Assessment: II - A patient with                            mild systemic disease. After reviewing the risks                            and benefits, the patient was deemed in                            satisfactory condition to undergo the procedure.  After obtaining informed consent, the colonoscope                            was passed under direct vision. Throughout the                            procedure, the patient's blood pressure, pulse, and                            oxygen saturations were monitored continuously. The                            Colonoscope was introduced through the anus and   advanced to the the cecum, identified by                            appendiceal orifice and ileocecal valve. The                            colonoscopy was performed without difficulty. The                            patient tolerated the procedure well. The quality                            of the bowel preparation was good. The ileocecal                            valve, appendiceal orifice, and rectum were                            photographed. The bowel preparation used was                            Miralax. Scope In: 2:12:07 PM Scope Out: 2:23:43 PM Scope Withdrawal Time: 0 hours 8 minutes 35 seconds  Total Procedure Duration: 0 hours 11 minutes 36 seconds  Findings:                 The perianal and digital rectal examinations were                            normal. Pertinent negatives include normal prostate                            (size, shape, and consistency).                           A diminutive polyp was found in the ascending                            colon. The polyp was sessile. The polyp was removed                            with a cold snare. Resection and retrieval were  complete. Verification of patient identification                            for the specimen was done. Estimated blood loss was                            minimal.                           Multiple diverticula were found in the sigmoid                            colon.                           The exam was otherwise without abnormality on                            direct and retroflexion views. Complications:            No immediate complications. Estimated Blood Loss:     Estimated blood loss was minimal. Impression:               - One diminutive polyp in the ascending colon,                            removed with a cold snare. Resected and retrieved.                           - Diverticulosis in the sigmoid colon.                           - The examination  was otherwise normal on direct                            and retroflexion views.                           - Personal history of colonic polyp - diminutive                            adenoma removed 2004, no polyps 2009.Marland Kitchen Recommendation:           - Patient has a contact number available for                            emergencies. The signs and symptoms of potential                            delayed complications were discussed with the                            patient. Return to normal activities tomorrow.                            Written discharge instructions were provided to the  patient.                           - Resume previous diet.                           - Continue present medications.                           - Repeat colonoscopy is recommended. The                            colonoscopy date will be determined after pathology                            results from today's exam become available for                            review. Gatha Mayer, MD 02/25/2018 2:29:42 PM This report has been signed electronically.

## 2018-02-25 NOTE — Progress Notes (Signed)
Report given to PACU, vss 

## 2018-02-25 NOTE — Progress Notes (Signed)
Pt's states no medical or surgical changes since previsit or office visit. 

## 2018-02-25 NOTE — Patient Instructions (Addendum)
I found and removed one tiny polyp. I will let you know pathology results and when to have another routine colonoscopy by mail and/or My Chart.  You also have a condition called diverticulosis - common and not usually a problem. Please read the handout provided.  I appreciate the opportunity to care for you. Gatha Mayer, MD, FACG   YOU HAD AN ENDOSCOPIC PROCEDURE TODAY AT [facility]:   Refer to the procedure report that was given to you for any specific questions about what was found during the examination.  If the procedure report does not answer your questions, please call your gastroenterologist to clarify.  If you requested that your care partner not be given the details of your procedure findings, then the procedure report has been included in a sealed envelope for you to review at your convenience later.  YOU SHOULD EXPECT: Some feelings of bloating in the abdomen. Passage of more gas than usual.  Walking can help get rid of the air that was put into your GI tract during the procedure and reduce the bloating. If you had a lower endoscopy (such as a colonoscopy or flexible sigmoidoscopy) you may notice spotting of blood in your stool or on the toilet paper. If you underwent a bowel prep for your procedure, you may not have a normal bowel movement for a few days.  Please Note:  You might notice some irritation and congestion in your nose or some drainage.  This is from the oxygen used during your procedure.  There is no need for concern and it should clear up in a day or so.  SYMPTOMS TO REPORT IMMEDIATELY:   Following lower endoscopy (colonoscopy or flexible sigmoidoscopy):  Excessive amounts of blood in the stool  Significant tenderness or worsening of abdominal pains  Swelling of the abdomen that is new, acute  Fever of 100F or higher  For urgent or emergent issues, a gastroenterologist can be reached at any hour by calling [phone].   DIET:  We  do recommend a small meal at first, but then you may proceed to your regular diet.  Drink plenty of fluids but you should avoid alcoholic beverages for 24 hours.  ACTIVITY:  You should plan to take it easy for the rest of today and you should NOT DRIVE or use heavy machinery until tomorrow (because of the sedation medicines used during the test).    FOLLOW UP: Our staff will call the number listed on your records the next business day following your procedure to check on you and address any questions or concerns that you may have regarding the information given to you following your procedure. If we do not reach you, we will leave a message.  However, if you are feeling well and you are not experiencing any problems, there is no need to return our call.  We will assume that you have returned to your regular daily activities without incident.  If any biopsies were taken you will be contacted by phone or by letter within the next 1-3 weeks.  Please call us at [phone] if you have not heard about the biopsies in 3 weeks.    SIGNATURES/CONFIDENTIALITY: You and/or your care partner have signed paperwork which will be entered into your electronic medical record.  These signatures attest to the fact that that the information above on your After Visit Summary has been reviewed and is understood.  Full responsibility of the confidentiality of this discharge information  lies with you and/or your care-partner.

## 2018-02-26 ENCOUNTER — Telehealth: Payer: Self-pay | Admitting: *Deleted

## 2018-02-26 ENCOUNTER — Telehealth: Payer: Self-pay

## 2018-02-26 NOTE — Telephone Encounter (Signed)
Patient's phone did not answer, almost like kept hanging up.  Will try again later this afternoon.

## 2018-02-26 NOTE — Telephone Encounter (Signed)
  Follow up Call-  Left Message on recording to call if any problems

## 2018-03-03 ENCOUNTER — Encounter: Payer: Self-pay | Admitting: Internal Medicine

## 2018-03-03 DIAGNOSIS — Z86010 Personal history of colonic polyps: Secondary | ICD-10-CM

## 2018-03-03 NOTE — Progress Notes (Signed)
Diminutive adenoma Recall 7 yrs 2027

## 2018-03-19 ENCOUNTER — Encounter: Payer: Medicare HMO | Admitting: Internal Medicine

## 2018-04-21 DIAGNOSIS — E785 Hyperlipidemia, unspecified: Secondary | ICD-10-CM | POA: Diagnosis not present

## 2018-04-21 DIAGNOSIS — Z0000 Encounter for general adult medical examination without abnormal findings: Secondary | ICD-10-CM | POA: Diagnosis not present

## 2018-04-21 DIAGNOSIS — Z23 Encounter for immunization: Secondary | ICD-10-CM | POA: Diagnosis not present

## 2018-04-21 DIAGNOSIS — L309 Dermatitis, unspecified: Secondary | ICD-10-CM | POA: Diagnosis not present

## 2018-04-21 DIAGNOSIS — Z125 Encounter for screening for malignant neoplasm of prostate: Secondary | ICD-10-CM | POA: Diagnosis not present

## 2018-05-10 DIAGNOSIS — Z87442 Personal history of urinary calculi: Secondary | ICD-10-CM | POA: Diagnosis not present

## 2018-06-28 DIAGNOSIS — C441121 Basal cell carcinoma of skin of right upper eyelid, including canthus: Secondary | ICD-10-CM | POA: Diagnosis not present

## 2018-06-28 DIAGNOSIS — D0461 Carcinoma in situ of skin of right upper limb, including shoulder: Secondary | ICD-10-CM | POA: Diagnosis not present

## 2018-06-28 DIAGNOSIS — Z85828 Personal history of other malignant neoplasm of skin: Secondary | ICD-10-CM | POA: Diagnosis not present

## 2018-06-28 DIAGNOSIS — C44519 Basal cell carcinoma of skin of other part of trunk: Secondary | ICD-10-CM | POA: Diagnosis not present

## 2018-06-28 DIAGNOSIS — L570 Actinic keratosis: Secondary | ICD-10-CM | POA: Diagnosis not present

## 2018-06-28 DIAGNOSIS — L821 Other seborrheic keratosis: Secondary | ICD-10-CM | POA: Diagnosis not present

## 2018-07-06 DIAGNOSIS — Z85828 Personal history of other malignant neoplasm of skin: Secondary | ICD-10-CM | POA: Diagnosis not present

## 2018-07-06 DIAGNOSIS — C441121 Basal cell carcinoma of skin of right upper eyelid, including canthus: Secondary | ICD-10-CM | POA: Diagnosis not present

## 2018-08-03 DIAGNOSIS — R69 Illness, unspecified: Secondary | ICD-10-CM | POA: Diagnosis not present

## 2018-08-30 DIAGNOSIS — L570 Actinic keratosis: Secondary | ICD-10-CM | POA: Diagnosis not present

## 2018-08-30 DIAGNOSIS — C44519 Basal cell carcinoma of skin of other part of trunk: Secondary | ICD-10-CM | POA: Diagnosis not present

## 2018-08-30 DIAGNOSIS — D0461 Carcinoma in situ of skin of right upper limb, including shoulder: Secondary | ICD-10-CM | POA: Diagnosis not present

## 2018-11-03 DIAGNOSIS — R69 Illness, unspecified: Secondary | ICD-10-CM | POA: Diagnosis not present

## 2018-11-17 DIAGNOSIS — H524 Presbyopia: Secondary | ICD-10-CM | POA: Diagnosis not present

## 2018-12-27 DIAGNOSIS — E785 Hyperlipidemia, unspecified: Secondary | ICD-10-CM | POA: Diagnosis not present

## 2018-12-27 DIAGNOSIS — Z833 Family history of diabetes mellitus: Secondary | ICD-10-CM | POA: Diagnosis not present

## 2018-12-27 DIAGNOSIS — Z809 Family history of malignant neoplasm, unspecified: Secondary | ICD-10-CM | POA: Diagnosis not present

## 2018-12-27 DIAGNOSIS — Z8249 Family history of ischemic heart disease and other diseases of the circulatory system: Secondary | ICD-10-CM | POA: Diagnosis not present

## 2018-12-27 DIAGNOSIS — Z7982 Long term (current) use of aspirin: Secondary | ICD-10-CM | POA: Diagnosis not present

## 2019-02-02 DIAGNOSIS — R69 Illness, unspecified: Secondary | ICD-10-CM | POA: Diagnosis not present

## 2019-02-16 DIAGNOSIS — Z20828 Contact with and (suspected) exposure to other viral communicable diseases: Secondary | ICD-10-CM | POA: Diagnosis not present

## 2019-02-23 DIAGNOSIS — Z20828 Contact with and (suspected) exposure to other viral communicable diseases: Secondary | ICD-10-CM | POA: Diagnosis not present

## 2019-03-01 DIAGNOSIS — Z20828 Contact with and (suspected) exposure to other viral communicable diseases: Secondary | ICD-10-CM | POA: Diagnosis not present

## 2019-03-04 DIAGNOSIS — Z85828 Personal history of other malignant neoplasm of skin: Secondary | ICD-10-CM | POA: Diagnosis not present

## 2019-03-04 DIAGNOSIS — L918 Other hypertrophic disorders of the skin: Secondary | ICD-10-CM | POA: Diagnosis not present

## 2019-03-04 DIAGNOSIS — L821 Other seborrheic keratosis: Secondary | ICD-10-CM | POA: Diagnosis not present

## 2019-03-04 DIAGNOSIS — L570 Actinic keratosis: Secondary | ICD-10-CM | POA: Diagnosis not present

## 2019-04-15 DIAGNOSIS — Z85828 Personal history of other malignant neoplasm of skin: Secondary | ICD-10-CM | POA: Diagnosis not present

## 2019-04-15 DIAGNOSIS — L239 Allergic contact dermatitis, unspecified cause: Secondary | ICD-10-CM | POA: Diagnosis not present

## 2019-05-04 DIAGNOSIS — E785 Hyperlipidemia, unspecified: Secondary | ICD-10-CM | POA: Diagnosis not present

## 2019-05-04 DIAGNOSIS — Z0000 Encounter for general adult medical examination without abnormal findings: Secondary | ICD-10-CM | POA: Diagnosis not present

## 2019-05-04 DIAGNOSIS — Z125 Encounter for screening for malignant neoplasm of prostate: Secondary | ICD-10-CM | POA: Diagnosis not present

## 2019-06-07 DIAGNOSIS — H40053 Ocular hypertension, bilateral: Secondary | ICD-10-CM | POA: Diagnosis not present

## 2019-07-12 DIAGNOSIS — H40013 Open angle with borderline findings, low risk, bilateral: Secondary | ICD-10-CM | POA: Diagnosis not present

## 2019-08-15 DIAGNOSIS — R69 Illness, unspecified: Secondary | ICD-10-CM | POA: Diagnosis not present

## 2019-09-05 DIAGNOSIS — L821 Other seborrheic keratosis: Secondary | ICD-10-CM | POA: Diagnosis not present

## 2019-09-05 DIAGNOSIS — D692 Other nonthrombocytopenic purpura: Secondary | ICD-10-CM | POA: Diagnosis not present

## 2019-09-05 DIAGNOSIS — L814 Other melanin hyperpigmentation: Secondary | ICD-10-CM | POA: Diagnosis not present

## 2019-09-05 DIAGNOSIS — D1801 Hemangioma of skin and subcutaneous tissue: Secondary | ICD-10-CM | POA: Diagnosis not present

## 2019-09-05 DIAGNOSIS — Z85828 Personal history of other malignant neoplasm of skin: Secondary | ICD-10-CM | POA: Diagnosis not present

## 2019-09-05 DIAGNOSIS — L918 Other hypertrophic disorders of the skin: Secondary | ICD-10-CM | POA: Diagnosis not present

## 2019-09-05 DIAGNOSIS — L309 Dermatitis, unspecified: Secondary | ICD-10-CM | POA: Diagnosis not present

## 2019-09-05 DIAGNOSIS — D034 Melanoma in situ of scalp and neck: Secondary | ICD-10-CM | POA: Diagnosis not present

## 2019-09-22 DIAGNOSIS — D485 Neoplasm of uncertain behavior of skin: Secondary | ICD-10-CM | POA: Diagnosis not present

## 2019-09-22 DIAGNOSIS — Z85828 Personal history of other malignant neoplasm of skin: Secondary | ICD-10-CM | POA: Diagnosis not present

## 2019-09-22 DIAGNOSIS — D034 Melanoma in situ of scalp and neck: Secondary | ICD-10-CM | POA: Diagnosis not present

## 2019-10-12 DIAGNOSIS — H40013 Open angle with borderline findings, low risk, bilateral: Secondary | ICD-10-CM | POA: Diagnosis not present

## 2019-10-26 DIAGNOSIS — R69 Illness, unspecified: Secondary | ICD-10-CM | POA: Diagnosis not present

## 2019-12-19 DIAGNOSIS — Z85820 Personal history of malignant melanoma of skin: Secondary | ICD-10-CM | POA: Diagnosis not present

## 2019-12-19 DIAGNOSIS — L918 Other hypertrophic disorders of the skin: Secondary | ICD-10-CM | POA: Diagnosis not present

## 2019-12-19 DIAGNOSIS — L821 Other seborrheic keratosis: Secondary | ICD-10-CM | POA: Diagnosis not present

## 2019-12-19 DIAGNOSIS — L738 Other specified follicular disorders: Secondary | ICD-10-CM | POA: Diagnosis not present

## 2019-12-19 DIAGNOSIS — Z85828 Personal history of other malignant neoplasm of skin: Secondary | ICD-10-CM | POA: Diagnosis not present

## 2019-12-19 DIAGNOSIS — L820 Inflamed seborrheic keratosis: Secondary | ICD-10-CM | POA: Diagnosis not present

## 2020-02-08 DIAGNOSIS — H52223 Regular astigmatism, bilateral: Secondary | ICD-10-CM | POA: Diagnosis not present

## 2020-03-12 DIAGNOSIS — D2362 Other benign neoplasm of skin of left upper limb, including shoulder: Secondary | ICD-10-CM | POA: Diagnosis not present

## 2020-03-12 DIAGNOSIS — Z85820 Personal history of malignant melanoma of skin: Secondary | ICD-10-CM | POA: Diagnosis not present

## 2020-03-12 DIAGNOSIS — L821 Other seborrheic keratosis: Secondary | ICD-10-CM | POA: Diagnosis not present

## 2020-03-12 DIAGNOSIS — Z85828 Personal history of other malignant neoplasm of skin: Secondary | ICD-10-CM | POA: Diagnosis not present

## 2020-06-08 DIAGNOSIS — H40053 Ocular hypertension, bilateral: Secondary | ICD-10-CM | POA: Diagnosis not present

## 2020-06-14 DIAGNOSIS — L918 Other hypertrophic disorders of the skin: Secondary | ICD-10-CM | POA: Diagnosis not present

## 2020-06-14 DIAGNOSIS — D485 Neoplasm of uncertain behavior of skin: Secondary | ICD-10-CM | POA: Diagnosis not present

## 2020-06-14 DIAGNOSIS — L84 Corns and callosities: Secondary | ICD-10-CM | POA: Diagnosis not present

## 2020-06-14 DIAGNOSIS — B070 Plantar wart: Secondary | ICD-10-CM | POA: Diagnosis not present

## 2020-06-14 DIAGNOSIS — L814 Other melanin hyperpigmentation: Secondary | ICD-10-CM | POA: Diagnosis not present

## 2020-06-14 DIAGNOSIS — D2362 Other benign neoplasm of skin of left upper limb, including shoulder: Secondary | ICD-10-CM | POA: Diagnosis not present

## 2020-06-14 DIAGNOSIS — L820 Inflamed seborrheic keratosis: Secondary | ICD-10-CM | POA: Diagnosis not present

## 2020-06-14 DIAGNOSIS — Z85820 Personal history of malignant melanoma of skin: Secondary | ICD-10-CM | POA: Diagnosis not present

## 2020-06-14 DIAGNOSIS — D225 Melanocytic nevi of trunk: Secondary | ICD-10-CM | POA: Diagnosis not present

## 2020-06-14 DIAGNOSIS — Z85828 Personal history of other malignant neoplasm of skin: Secondary | ICD-10-CM | POA: Diagnosis not present

## 2020-06-14 DIAGNOSIS — L821 Other seborrheic keratosis: Secondary | ICD-10-CM | POA: Diagnosis not present

## 2020-06-20 IMAGING — DX DG ABDOMEN 1V
2 series · 2 of 2 positions shown · non-contrast
Comparison: CT abdomen and pelvis 09/18/2017.

CLINICAL DATA: Preoperative examination for patient with a left
ureteral stone.

EXAM:
ABDOMEN - 1 VIEW

[abdomen kub (1 of 2)]
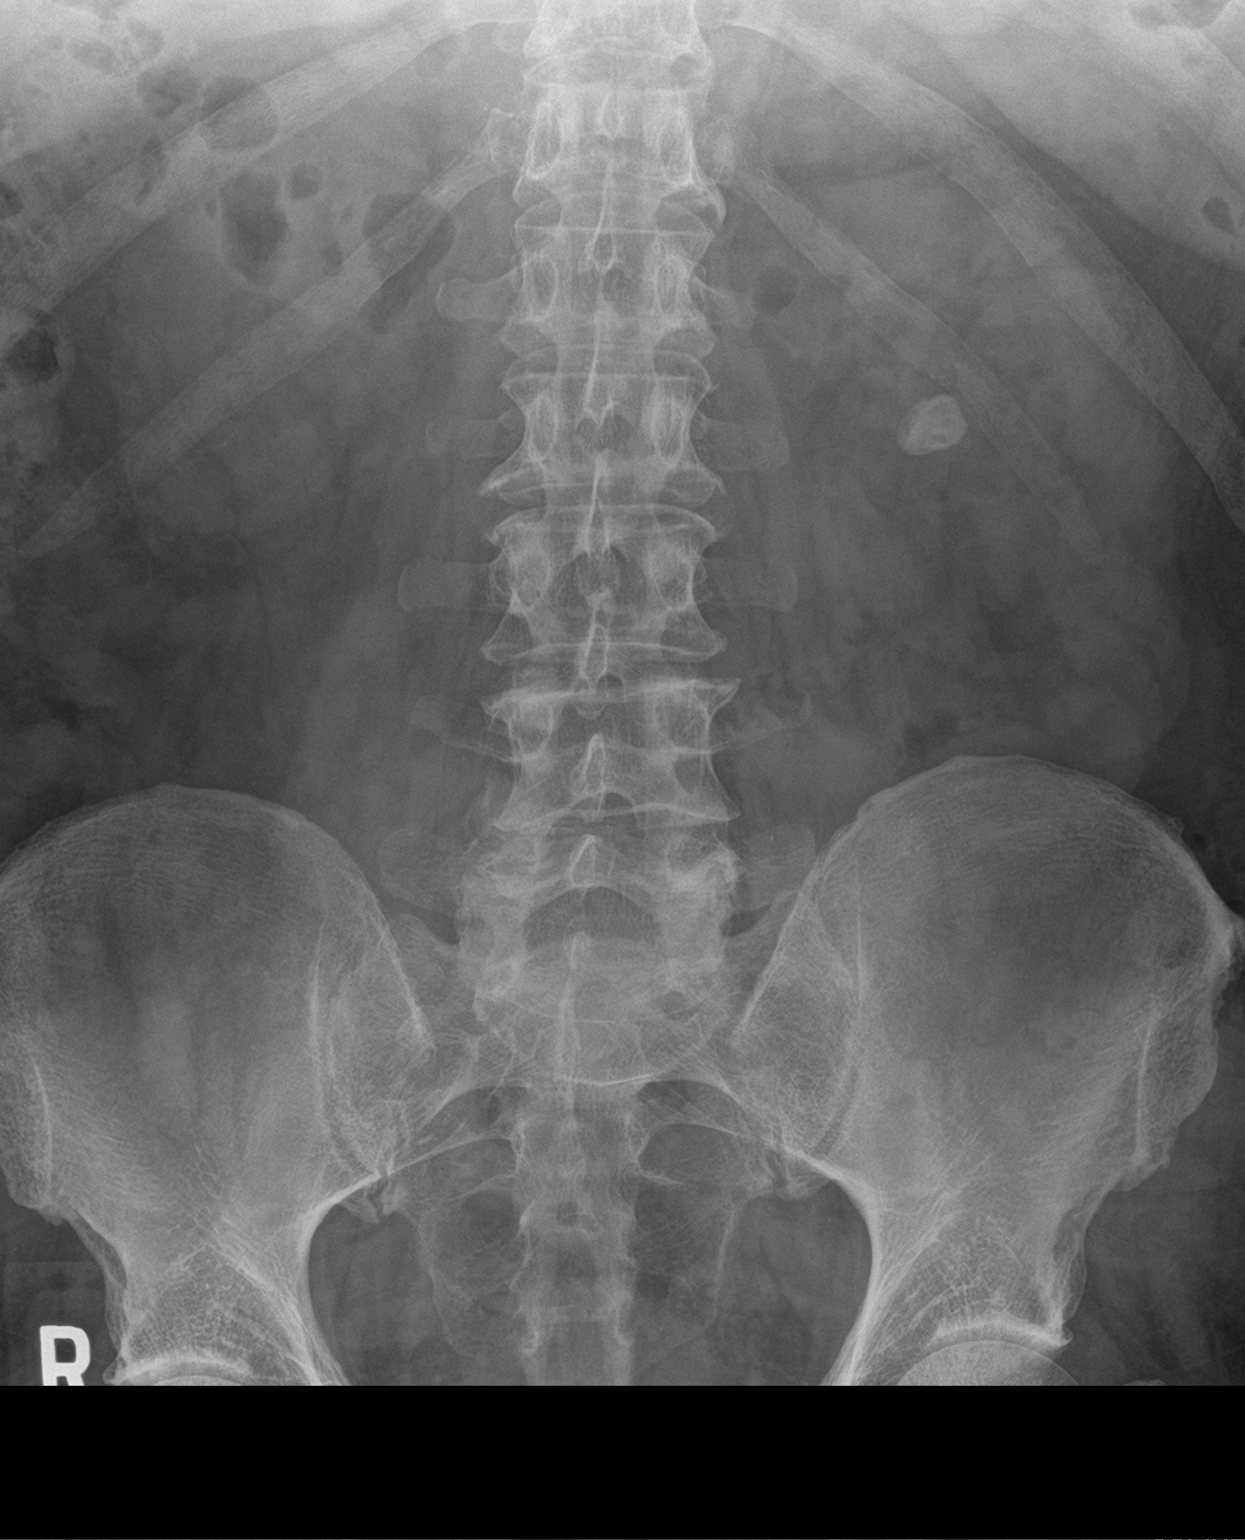

[abdomen kub (2 of 2)]
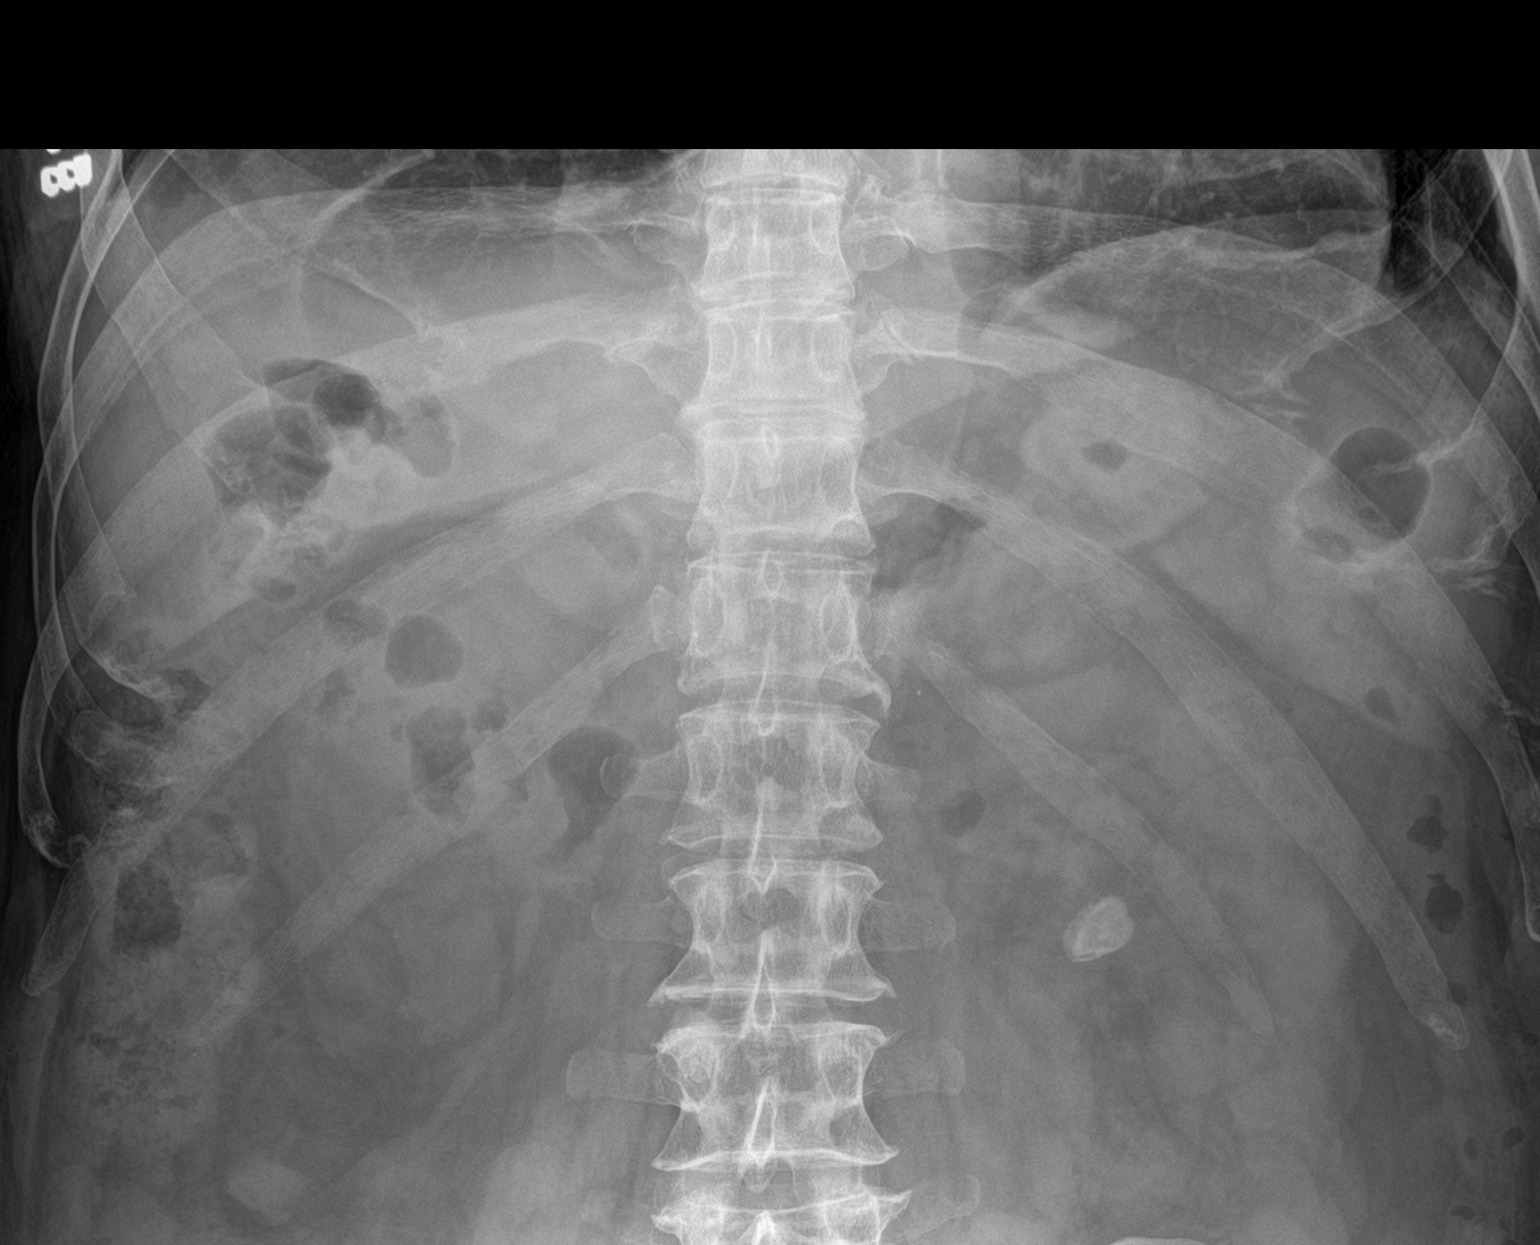

[2 of 2 positions shown; findings below may reference images not displayed]

FINDINGS: A calcification measuring 1.4 cm craniocaudal by 1.8 cm transverse
is seen in the left upper quadrant of the abdomen and correlates
with the stone in the left renal pelvis on prior CT. No other
urinary tract stones are identified. Bowel gas pattern is normal. No
bony abnormality.
IMPRESSION: Large calcification in the left upper quadrant correlates with stone
in the left renal pelvis on the prior exam.

## 2020-08-17 DIAGNOSIS — Z0000 Encounter for general adult medical examination without abnormal findings: Secondary | ICD-10-CM | POA: Diagnosis not present

## 2020-08-27 DIAGNOSIS — Z85828 Personal history of other malignant neoplasm of skin: Secondary | ICD-10-CM | POA: Diagnosis not present

## 2020-08-27 DIAGNOSIS — Z9989 Dependence on other enabling machines and devices: Secondary | ICD-10-CM | POA: Diagnosis not present

## 2020-08-27 DIAGNOSIS — I700 Atherosclerosis of aorta: Secondary | ICD-10-CM | POA: Diagnosis not present

## 2020-08-27 DIAGNOSIS — Z87442 Personal history of urinary calculi: Secondary | ICD-10-CM | POA: Diagnosis not present

## 2020-08-27 DIAGNOSIS — Z86006 Personal history of melanoma in-situ: Secondary | ICD-10-CM | POA: Diagnosis not present

## 2020-08-27 DIAGNOSIS — Z8589 Personal history of malignant neoplasm of other organs and systems: Secondary | ICD-10-CM | POA: Diagnosis not present

## 2020-08-27 DIAGNOSIS — G4733 Obstructive sleep apnea (adult) (pediatric): Secondary | ICD-10-CM | POA: Diagnosis not present

## 2020-08-27 DIAGNOSIS — Z0000 Encounter for general adult medical examination without abnormal findings: Secondary | ICD-10-CM | POA: Diagnosis not present

## 2020-08-27 DIAGNOSIS — E782 Mixed hyperlipidemia: Secondary | ICD-10-CM | POA: Diagnosis not present

## 2020-09-12 DIAGNOSIS — M7071 Other bursitis of hip, right hip: Secondary | ICD-10-CM | POA: Diagnosis not present

## 2020-09-14 DIAGNOSIS — L814 Other melanin hyperpigmentation: Secondary | ICD-10-CM | POA: Diagnosis not present

## 2020-09-14 DIAGNOSIS — Z85828 Personal history of other malignant neoplasm of skin: Secondary | ICD-10-CM | POA: Diagnosis not present

## 2020-09-14 DIAGNOSIS — Z85820 Personal history of malignant melanoma of skin: Secondary | ICD-10-CM | POA: Diagnosis not present

## 2020-09-14 DIAGNOSIS — D2362 Other benign neoplasm of skin of left upper limb, including shoulder: Secondary | ICD-10-CM | POA: Diagnosis not present

## 2020-09-14 DIAGNOSIS — B353 Tinea pedis: Secondary | ICD-10-CM | POA: Diagnosis not present

## 2020-09-14 DIAGNOSIS — L570 Actinic keratosis: Secondary | ICD-10-CM | POA: Diagnosis not present

## 2020-10-05 DIAGNOSIS — H40053 Ocular hypertension, bilateral: Secondary | ICD-10-CM | POA: Diagnosis not present

## 2020-12-21 DIAGNOSIS — R52 Pain, unspecified: Secondary | ICD-10-CM | POA: Diagnosis not present

## 2020-12-21 DIAGNOSIS — M76891 Other specified enthesopathies of right lower limb, excluding foot: Secondary | ICD-10-CM | POA: Diagnosis not present

## 2021-01-15 DIAGNOSIS — R059 Cough, unspecified: Secondary | ICD-10-CM | POA: Diagnosis not present

## 2021-02-22 DIAGNOSIS — H5203 Hypermetropia, bilateral: Secondary | ICD-10-CM | POA: Diagnosis not present

## 2021-02-27 DIAGNOSIS — C439 Malignant melanoma of skin, unspecified: Secondary | ICD-10-CM | POA: Diagnosis not present

## 2021-02-27 DIAGNOSIS — R0981 Nasal congestion: Secondary | ICD-10-CM | POA: Diagnosis not present

## 2021-02-27 DIAGNOSIS — I700 Atherosclerosis of aorta: Secondary | ICD-10-CM | POA: Diagnosis not present

## 2021-02-27 DIAGNOSIS — E782 Mixed hyperlipidemia: Secondary | ICD-10-CM | POA: Diagnosis not present

## 2021-02-28 DIAGNOSIS — E782 Mixed hyperlipidemia: Secondary | ICD-10-CM | POA: Diagnosis not present

## 2021-03-26 DIAGNOSIS — L300 Nummular dermatitis: Secondary | ICD-10-CM | POA: Diagnosis not present

## 2021-03-26 DIAGNOSIS — L84 Corns and callosities: Secondary | ICD-10-CM | POA: Diagnosis not present

## 2021-03-26 DIAGNOSIS — L918 Other hypertrophic disorders of the skin: Secondary | ICD-10-CM | POA: Diagnosis not present

## 2021-03-26 DIAGNOSIS — Z85820 Personal history of malignant melanoma of skin: Secondary | ICD-10-CM | POA: Diagnosis not present

## 2021-03-26 DIAGNOSIS — Z85828 Personal history of other malignant neoplasm of skin: Secondary | ICD-10-CM | POA: Diagnosis not present

## 2021-03-26 DIAGNOSIS — D2362 Other benign neoplasm of skin of left upper limb, including shoulder: Secondary | ICD-10-CM | POA: Diagnosis not present

## 2021-03-26 DIAGNOSIS — L814 Other melanin hyperpigmentation: Secondary | ICD-10-CM | POA: Diagnosis not present

## 2021-03-26 DIAGNOSIS — B070 Plantar wart: Secondary | ICD-10-CM | POA: Diagnosis not present

## 2021-03-26 DIAGNOSIS — L821 Other seborrheic keratosis: Secondary | ICD-10-CM | POA: Diagnosis not present

## 2021-03-26 DIAGNOSIS — L820 Inflamed seborrheic keratosis: Secondary | ICD-10-CM | POA: Diagnosis not present

## 2021-06-27 DIAGNOSIS — R0989 Other specified symptoms and signs involving the circulatory and respiratory systems: Secondary | ICD-10-CM | POA: Diagnosis not present

## 2021-07-05 DIAGNOSIS — H401131 Primary open-angle glaucoma, bilateral, mild stage: Secondary | ICD-10-CM | POA: Diagnosis not present

## 2021-07-26 DIAGNOSIS — Z23 Encounter for immunization: Secondary | ICD-10-CM | POA: Diagnosis not present

## 2021-07-26 DIAGNOSIS — B070 Plantar wart: Secondary | ICD-10-CM | POA: Diagnosis not present

## 2021-08-26 DIAGNOSIS — E785 Hyperlipidemia, unspecified: Secondary | ICD-10-CM | POA: Diagnosis not present

## 2021-08-26 DIAGNOSIS — Z0000 Encounter for general adult medical examination without abnormal findings: Secondary | ICD-10-CM | POA: Diagnosis not present

## 2021-08-26 DIAGNOSIS — R972 Elevated prostate specific antigen [PSA]: Secondary | ICD-10-CM | POA: Diagnosis not present

## 2021-08-28 DIAGNOSIS — Z0000 Encounter for general adult medical examination without abnormal findings: Secondary | ICD-10-CM | POA: Diagnosis not present

## 2021-08-28 DIAGNOSIS — B070 Plantar wart: Secondary | ICD-10-CM | POA: Diagnosis not present

## 2021-08-28 DIAGNOSIS — R972 Elevated prostate specific antigen [PSA]: Secondary | ICD-10-CM | POA: Diagnosis not present

## 2021-09-24 DIAGNOSIS — B070 Plantar wart: Secondary | ICD-10-CM | POA: Diagnosis not present

## 2021-09-24 DIAGNOSIS — L84 Corns and callosities: Secondary | ICD-10-CM | POA: Diagnosis not present

## 2021-09-25 DIAGNOSIS — B079 Viral wart, unspecified: Secondary | ICD-10-CM | POA: Diagnosis not present

## 2021-09-25 DIAGNOSIS — M7742 Metatarsalgia, left foot: Secondary | ICD-10-CM | POA: Diagnosis not present

## 2021-09-25 DIAGNOSIS — L859 Epidermal thickening, unspecified: Secondary | ICD-10-CM | POA: Diagnosis not present

## 2021-09-30 DIAGNOSIS — L821 Other seborrheic keratosis: Secondary | ICD-10-CM | POA: Diagnosis not present

## 2021-09-30 DIAGNOSIS — L918 Other hypertrophic disorders of the skin: Secondary | ICD-10-CM | POA: Diagnosis not present

## 2021-09-30 DIAGNOSIS — D1801 Hemangioma of skin and subcutaneous tissue: Secondary | ICD-10-CM | POA: Diagnosis not present

## 2021-09-30 DIAGNOSIS — L570 Actinic keratosis: Secondary | ICD-10-CM | POA: Diagnosis not present

## 2021-09-30 DIAGNOSIS — D225 Melanocytic nevi of trunk: Secondary | ICD-10-CM | POA: Diagnosis not present

## 2021-09-30 DIAGNOSIS — Z85828 Personal history of other malignant neoplasm of skin: Secondary | ICD-10-CM | POA: Diagnosis not present

## 2021-09-30 DIAGNOSIS — Z85820 Personal history of malignant melanoma of skin: Secondary | ICD-10-CM | POA: Diagnosis not present

## 2021-10-28 DIAGNOSIS — R972 Elevated prostate specific antigen [PSA]: Secondary | ICD-10-CM | POA: Diagnosis not present

## 2021-11-04 DIAGNOSIS — R972 Elevated prostate specific antigen [PSA]: Secondary | ICD-10-CM | POA: Diagnosis not present

## 2021-11-04 DIAGNOSIS — Z23 Encounter for immunization: Secondary | ICD-10-CM | POA: Diagnosis not present

## 2021-11-06 DIAGNOSIS — H40053 Ocular hypertension, bilateral: Secondary | ICD-10-CM | POA: Diagnosis not present

## 2021-11-08 DIAGNOSIS — B079 Viral wart, unspecified: Secondary | ICD-10-CM | POA: Diagnosis not present

## 2021-11-08 DIAGNOSIS — M7742 Metatarsalgia, left foot: Secondary | ICD-10-CM | POA: Diagnosis not present

## 2021-11-22 DIAGNOSIS — N401 Enlarged prostate with lower urinary tract symptoms: Secondary | ICD-10-CM | POA: Diagnosis not present

## 2021-11-22 DIAGNOSIS — R972 Elevated prostate specific antigen [PSA]: Secondary | ICD-10-CM | POA: Diagnosis not present

## 2021-11-22 DIAGNOSIS — R351 Nocturia: Secondary | ICD-10-CM | POA: Diagnosis not present

## 2021-11-28 ENCOUNTER — Other Ambulatory Visit: Payer: Self-pay | Admitting: Urology

## 2021-11-28 DIAGNOSIS — R972 Elevated prostate specific antigen [PSA]: Secondary | ICD-10-CM

## 2021-12-25 ENCOUNTER — Ambulatory Visit
Admission: RE | Admit: 2021-12-25 | Discharge: 2021-12-25 | Disposition: A | Discharge status: Home / Self Care | Payer: Medicare HMO | Source: Ambulatory Visit | Attending: Urology | Admitting: Urology

## 2021-12-25 DIAGNOSIS — R972 Elevated prostate specific antigen [PSA]: Secondary | ICD-10-CM | POA: Diagnosis not present

## 2021-12-25 MED ORDER — GADOPICLENOL 0.5 MMOL/ML IV SOLN
10.0000 mL | Freq: Once | INTRAVENOUS | Status: AC | PRN
  Administered 2021-12-25: 10 mL via INTRAVENOUS

## 2022-01-01 DIAGNOSIS — D075 Carcinoma in situ of prostate: Secondary | ICD-10-CM | POA: Diagnosis not present

## 2022-01-01 DIAGNOSIS — C61 Malignant neoplasm of prostate: Secondary | ICD-10-CM | POA: Diagnosis not present

## 2022-01-29 DIAGNOSIS — C61 Malignant neoplasm of prostate: Secondary | ICD-10-CM | POA: Diagnosis not present

## 2022-01-29 DIAGNOSIS — N401 Enlarged prostate with lower urinary tract symptoms: Secondary | ICD-10-CM | POA: Diagnosis not present

## 2022-01-29 DIAGNOSIS — R972 Elevated prostate specific antigen [PSA]: Secondary | ICD-10-CM | POA: Diagnosis not present

## 2022-01-29 DIAGNOSIS — N5201 Erectile dysfunction due to arterial insufficiency: Secondary | ICD-10-CM | POA: Diagnosis not present

## 2022-01-29 DIAGNOSIS — R351 Nocturia: Secondary | ICD-10-CM | POA: Diagnosis not present

## 2022-03-06 DIAGNOSIS — Z1330 Encounter for screening examination for mental health and behavioral disorders, unspecified: Secondary | ICD-10-CM | POA: Diagnosis not present

## 2022-03-06 DIAGNOSIS — C61 Malignant neoplasm of prostate: Secondary | ICD-10-CM | POA: Diagnosis not present

## 2022-03-31 DIAGNOSIS — Z85828 Personal history of other malignant neoplasm of skin: Secondary | ICD-10-CM | POA: Diagnosis not present

## 2022-03-31 DIAGNOSIS — D1801 Hemangioma of skin and subcutaneous tissue: Secondary | ICD-10-CM | POA: Diagnosis not present

## 2022-03-31 DIAGNOSIS — L821 Other seborrheic keratosis: Secondary | ICD-10-CM | POA: Diagnosis not present

## 2022-03-31 DIAGNOSIS — B070 Plantar wart: Secondary | ICD-10-CM | POA: Diagnosis not present

## 2022-03-31 DIAGNOSIS — L918 Other hypertrophic disorders of the skin: Secondary | ICD-10-CM | POA: Diagnosis not present

## 2022-03-31 DIAGNOSIS — Z85820 Personal history of malignant melanoma of skin: Secondary | ICD-10-CM | POA: Diagnosis not present

## 2022-03-31 DIAGNOSIS — L738 Other specified follicular disorders: Secondary | ICD-10-CM | POA: Diagnosis not present

## 2022-03-31 DIAGNOSIS — D2239 Melanocytic nevi of other parts of face: Secondary | ICD-10-CM | POA: Diagnosis not present

## 2022-03-31 DIAGNOSIS — L570 Actinic keratosis: Secondary | ICD-10-CM | POA: Diagnosis not present

## 2022-04-28 DIAGNOSIS — C61 Malignant neoplasm of prostate: Secondary | ICD-10-CM | POA: Diagnosis not present

## 2022-05-05 DIAGNOSIS — C61 Malignant neoplasm of prostate: Secondary | ICD-10-CM | POA: Diagnosis not present

## 2022-05-05 DIAGNOSIS — R3912 Poor urinary stream: Secondary | ICD-10-CM | POA: Diagnosis not present

## 2022-05-05 DIAGNOSIS — N401 Enlarged prostate with lower urinary tract symptoms: Secondary | ICD-10-CM | POA: Diagnosis not present

## 2022-05-13 DIAGNOSIS — M629 Disorder of muscle, unspecified: Secondary | ICD-10-CM | POA: Diagnosis not present

## 2022-05-13 DIAGNOSIS — B070 Plantar wart: Secondary | ICD-10-CM | POA: Diagnosis not present

## 2022-05-13 DIAGNOSIS — L84 Corns and callosities: Secondary | ICD-10-CM | POA: Diagnosis not present

## 2022-06-03 DIAGNOSIS — R69 Illness, unspecified: Secondary | ICD-10-CM | POA: Diagnosis not present

## 2022-07-11 DIAGNOSIS — R69 Illness, unspecified: Secondary | ICD-10-CM | POA: Diagnosis not present

## 2022-07-16 DIAGNOSIS — H401131 Primary open-angle glaucoma, bilateral, mild stage: Secondary | ICD-10-CM | POA: Diagnosis not present

## 2022-07-30 DIAGNOSIS — C61 Malignant neoplasm of prostate: Secondary | ICD-10-CM | POA: Diagnosis not present

## 2022-08-06 DIAGNOSIS — C61 Malignant neoplasm of prostate: Secondary | ICD-10-CM | POA: Diagnosis not present

## 2022-08-10 DIAGNOSIS — R69 Illness, unspecified: Secondary | ICD-10-CM | POA: Diagnosis not present

## 2022-08-22 DIAGNOSIS — R69 Illness, unspecified: Secondary | ICD-10-CM | POA: Diagnosis not present

## 2022-08-26 DIAGNOSIS — Z0000 Encounter for general adult medical examination without abnormal findings: Secondary | ICD-10-CM | POA: Diagnosis not present

## 2022-08-26 DIAGNOSIS — E785 Hyperlipidemia, unspecified: Secondary | ICD-10-CM | POA: Diagnosis not present

## 2022-08-26 DIAGNOSIS — R972 Elevated prostate specific antigen [PSA]: Secondary | ICD-10-CM | POA: Diagnosis not present

## 2022-09-01 DIAGNOSIS — Z85828 Personal history of other malignant neoplasm of skin: Secondary | ICD-10-CM | POA: Diagnosis not present

## 2022-09-01 DIAGNOSIS — Z87442 Personal history of urinary calculi: Secondary | ICD-10-CM | POA: Diagnosis not present

## 2022-09-01 DIAGNOSIS — Z8589 Personal history of malignant neoplasm of other organs and systems: Secondary | ICD-10-CM | POA: Diagnosis not present

## 2022-09-01 DIAGNOSIS — Z86006 Personal history of melanoma in-situ: Secondary | ICD-10-CM | POA: Diagnosis not present

## 2022-09-01 DIAGNOSIS — C61 Malignant neoplasm of prostate: Secondary | ICD-10-CM | POA: Diagnosis not present

## 2022-09-01 DIAGNOSIS — E782 Mixed hyperlipidemia: Secondary | ICD-10-CM | POA: Diagnosis not present

## 2022-09-01 DIAGNOSIS — B070 Plantar wart: Secondary | ICD-10-CM | POA: Diagnosis not present

## 2022-09-01 DIAGNOSIS — R911 Solitary pulmonary nodule: Secondary | ICD-10-CM | POA: Diagnosis not present

## 2022-09-01 DIAGNOSIS — Z0000 Encounter for general adult medical examination without abnormal findings: Secondary | ICD-10-CM | POA: Diagnosis not present

## 2022-09-01 DIAGNOSIS — G4733 Obstructive sleep apnea (adult) (pediatric): Secondary | ICD-10-CM | POA: Diagnosis not present

## 2022-09-01 DIAGNOSIS — I700 Atherosclerosis of aorta: Secondary | ICD-10-CM | POA: Diagnosis not present

## 2022-09-04 DIAGNOSIS — B079 Viral wart, unspecified: Secondary | ICD-10-CM | POA: Diagnosis not present

## 2022-09-23 DIAGNOSIS — Z85828 Personal history of other malignant neoplasm of skin: Secondary | ICD-10-CM | POA: Diagnosis not present

## 2022-09-23 DIAGNOSIS — H269 Unspecified cataract: Secondary | ICD-10-CM | POA: Diagnosis not present

## 2022-09-23 DIAGNOSIS — Z008 Encounter for other general examination: Secondary | ICD-10-CM | POA: Diagnosis not present

## 2022-09-23 DIAGNOSIS — Z8249 Family history of ischemic heart disease and other diseases of the circulatory system: Secondary | ICD-10-CM | POA: Diagnosis not present

## 2022-09-23 DIAGNOSIS — B070 Plantar wart: Secondary | ICD-10-CM | POA: Diagnosis not present

## 2022-09-23 DIAGNOSIS — R030 Elevated blood-pressure reading, without diagnosis of hypertension: Secondary | ICD-10-CM | POA: Diagnosis not present

## 2022-09-23 DIAGNOSIS — E669 Obesity, unspecified: Secondary | ICD-10-CM | POA: Diagnosis not present

## 2022-09-23 DIAGNOSIS — C61 Malignant neoplasm of prostate: Secondary | ICD-10-CM | POA: Diagnosis not present

## 2022-09-23 DIAGNOSIS — Z809 Family history of malignant neoplasm, unspecified: Secondary | ICD-10-CM | POA: Diagnosis not present

## 2022-09-23 DIAGNOSIS — E785 Hyperlipidemia, unspecified: Secondary | ICD-10-CM | POA: Diagnosis not present

## 2022-09-23 DIAGNOSIS — G4733 Obstructive sleep apnea (adult) (pediatric): Secondary | ICD-10-CM | POA: Diagnosis not present

## 2022-11-24 DIAGNOSIS — J019 Acute sinusitis, unspecified: Secondary | ICD-10-CM | POA: Diagnosis not present

## 2022-11-24 DIAGNOSIS — B9689 Other specified bacterial agents as the cause of diseases classified elsewhere: Secondary | ICD-10-CM | POA: Diagnosis not present

## 2022-11-24 DIAGNOSIS — R0982 Postnasal drip: Secondary | ICD-10-CM | POA: Diagnosis not present

## 2022-11-28 NOTE — Progress Notes (Unsigned)
12/01/22- 72 yoM never smoker (chews) for sleep evaluation courtesy of Kirstin Shepperson, PA-C with concern of OSA on CPAP Medical problem list includes Pulmonary Nodule, hx Kidney Stone, Hyperlipidemia NPSG 07/29/2007- AHI 10.1/hr, desat to 84%, body weight 230 lbs Epworth score- Body weight today CPAP

## 2022-12-01 ENCOUNTER — Ambulatory Visit (INDEPENDENT_AMBULATORY_CARE_PROVIDER_SITE_OTHER): Payer: Medicare HMO | Admitting: Internal Medicine

## 2022-12-01 ENCOUNTER — Encounter: Payer: Self-pay | Admitting: Internal Medicine

## 2022-12-01 VITALS — BP 126/72 | HR 65 | Temp 98.3°F | Ht 74.0 in | Wt 253.0 lb

## 2022-12-01 DIAGNOSIS — R051 Acute cough: Secondary | ICD-10-CM

## 2022-12-01 DIAGNOSIS — G4733 Obstructive sleep apnea (adult) (pediatric): Secondary | ICD-10-CM | POA: Insufficient documentation

## 2022-12-01 DIAGNOSIS — B079 Viral wart, unspecified: Secondary | ICD-10-CM | POA: Diagnosis not present

## 2022-12-01 DIAGNOSIS — R059 Cough, unspecified: Secondary | ICD-10-CM | POA: Insufficient documentation

## 2022-12-01 NOTE — Assessment & Plan Note (Signed)
Resolving a recent cold. We can reassess this as needed.

## 2022-12-01 NOTE — Assessment & Plan Note (Signed)
He has been very compliant with CPAP and has benefited long-term.  Machine is old and he needs replacement machine and mask.  He does not have an established DME company anymore but suggests [address] in [place]. Plan replacement CPAP auto 5-20

## 2022-12-01 NOTE — Patient Instructions (Signed)
Order- DME [name]  please replace old CPAP machine (NPSG 07/29/07), auto 5-20, mask of choice, humidifier, supplies, AirView/ card  If we have to schedule a new sleep study, we will set up a home sleep test, and you can call me about 2 weeks after that for results and recommendations.

## 2022-12-02 ENCOUNTER — Telehealth: Payer: Self-pay

## 2022-12-02 NOTE — Telephone Encounter (Signed)
ATC patient to let him know that he will need a Home sleep study . Was unable to leave a voicemail on patient's phone . Will also send patient a mychart message.

## 2022-12-02 NOTE — Addendum Note (Signed)
Addended by: Lanna Poche on: 12/02/2022 09:46 AM   Modules accepted: Orders

## 2022-12-12 DIAGNOSIS — G4730 Sleep apnea, unspecified: Secondary | ICD-10-CM | POA: Diagnosis not present

## 2023-01-05 ENCOUNTER — Telehealth: Payer: Self-pay | Admitting: Internal Medicine

## 2023-01-07 NOTE — Telephone Encounter (Signed)
ATC patient-- call was answered and then ended.

## 2023-01-07 NOTE — Telephone Encounter (Signed)
Lm x1 for patient.   HST results are not available via epic.  PCC's, can you check on results.

## 2023-01-07 NOTE — Telephone Encounter (Signed)
Patient is returning phone call. Patient phone number is [phone].

## 2023-01-08 NOTE — Telephone Encounter (Signed)
ATC patient-- call was answered and then ended.

## 2023-01-08 NOTE — Telephone Encounter (Signed)
These results are still waiting to be signed by Dr Maple Hudson

## 2023-01-09 ENCOUNTER — Ambulatory Visit: Payer: Medicare HMO

## 2023-01-09 DIAGNOSIS — G4733 Obstructive sleep apnea (adult) (pediatric): Secondary | ICD-10-CM

## 2023-01-09 NOTE — Telephone Encounter (Signed)
Dr Maple Hudson, before we call the pt back again will you please advise on his HST results? Thanks!

## 2023-01-14 NOTE — Telephone Encounter (Signed)
I had read this study back in November. I don't know why [facility] hadn't released results- I had to go digging for it. My apologies. Home sleep test from 12/12/22 showed moderate obstructive sleep apnea, averaging 14 apneas/ hour. We had sent an order to [facility] for CPAP back in November. If they are waiting for results of this HST, please re-send that order- so Mr Uhls can get what he needs. If there are any further problems please let me know.

## 2023-01-15 ENCOUNTER — Telehealth: Payer: Self-pay | Admitting: Internal Medicine

## 2023-01-15 NOTE — Telephone Encounter (Signed)
Called the pt and there was no answer- LMTCB    

## 2023-01-15 NOTE — Telephone Encounter (Signed)
Patient is returning missed call in regards to his test results.

## 2023-01-16 NOTE — Telephone Encounter (Signed)
Note I had read this study back in November. I don't know why [facility] hadn't released results- I had to go digging for it. My apologies. Home sleep test from 12/12/22 showed moderate obstructive sleep apnea, averaging 14 apneas/ hour. We had sent an order to [facility] for CPAP back in November. If they are waiting for results of this HST, please re-send that order- so Dennis Villanueva can get what he needs. If there are any further problems please let me know.     I called and spoke with the pt and notified of response per Dr Maple Hudson  Pt verbalized understanding. PCC's- can you please be sure that [name] gets his sleep study? He still has not received new machine that we had ordered back in Nov 2024. Thanks!

## 2023-01-16 NOTE — Telephone Encounter (Signed)
Pt calling in to go over his test results.

## 2023-01-23 ENCOUNTER — Encounter: Payer: Self-pay | Admitting: Internal Medicine

## 2023-01-23 NOTE — Telephone Encounter (Signed)
 Patient checking on message for CPAP machine. Patient phone number is [phone].

## 2023-01-26 NOTE — Telephone Encounter (Signed)
 Message routed to [facility] to assist.  Dr. Neysa sent the info for me to get CPAP equipment to [facility] in [place].  But I found out that they do not accept [employer].  I have called around and found [facility] does accept my insurance.  Do you recommend them and if so can you transfer the necessary info to [facility]? If you do not recommend [facility], please let me know of someone. Thank you, Dennis Villanueva

## 2023-01-26 NOTE — Telephone Encounter (Signed)
 I have sent to order to [facility] to process

## 2023-01-27 NOTE — Telephone Encounter (Signed)
 I have received a message from Industry with [facility] that she has pulled the replacement cpap order

## 2023-02-13 DIAGNOSIS — G4733 Obstructive sleep apnea (adult) (pediatric): Secondary | ICD-10-CM | POA: Diagnosis not present

## 2023-02-16 ENCOUNTER — Other Ambulatory Visit: Payer: Self-pay | Admitting: Urology

## 2023-02-16 DIAGNOSIS — C61 Malignant neoplasm of prostate: Secondary | ICD-10-CM

## 2023-02-18 DIAGNOSIS — G4733 Obstructive sleep apnea (adult) (pediatric): Secondary | ICD-10-CM | POA: Diagnosis not present

## 2023-02-24 DIAGNOSIS — E782 Mixed hyperlipidemia: Secondary | ICD-10-CM | POA: Diagnosis not present

## 2023-02-24 DIAGNOSIS — R972 Elevated prostate specific antigen [PSA]: Secondary | ICD-10-CM | POA: Diagnosis not present

## 2023-02-24 DIAGNOSIS — I700 Atherosclerosis of aorta: Secondary | ICD-10-CM | POA: Diagnosis not present

## 2023-02-25 DIAGNOSIS — H52223 Regular astigmatism, bilateral: Secondary | ICD-10-CM | POA: Diagnosis not present

## 2023-02-27 NOTE — Progress Notes (Signed)
 12/01/22- 72 yoM never smoker (chews) for sleep evaluation courtesy of Kirstin Shepperson, PA-C with concern of OSA on CPAP Medical problem list includes Pulmonary Nodule, hx Kidney Stone, Hyperlipidemia NPSG 07/29/2007- AHI 10.1/hr, desat to 84%, body weight 230 lbs Epworth score- 14 Body weight today 253 lbs CPAP-he has been using his original machine every night.  Nasal pillows mask with totally worn out headgear.  He thinks setting is 12.  He has bought supplies from drugstore when needed and has not had connection with the DME company but he remembers.  We talked about alternatives send he works a lot in the [place] area, suggesting [employer].  He depends on CPAP and does not think he can sleep without it. He denies ENT surgery, heart or lung disease.  Does not use sleep medicines.  1 or 2 cups of morning coffee.  03/02/23- 72 yoM never smoker followed for OSA, complicated by Pulmonary Nodule, hx Kidney Stone, Hyperlipidemia HST 12/12/22- AHI 14/hr, desat to 88%, body weight 230 lbs Body weight today-250 lbs CPAP auto 5-20/ [facility] (previously [name])  replacement order 12/01/22 Download compliance-47%, AHI 1.5/hr    restarted Jan 24, used every night since. Discussed the use of AI scribe software for clinical note transcription with the patient, who gave verbal consent to proceed.  History of Present Illness   The patient, with a history of sleep apnea, presents for follow-up after receiving a new CPAP machine two weeks ago. Initially, he experienced a sensation of suffocation and struggled to breathe when the machine first started each night. However, this has improved significantly over the last several nights, and he now feels like he is breathing normally almost immediately. He is satisfied with the new mask, which fits well and stays in place throughout the night. We discussed ability to adjust Ramp and Humidifier if needed.  Occasionally, he wakes up in the middle  of the night with a dry mouth and throat, which is relieved by drinking water or orange juice. He has not adjusted the humidifier settings on the machine yet.  In addition to his sleep apnea, the patient recently had a cold, which caused some congestion but did not significantly impact his sleep or CPAP use.     ROS-see HPI   + = positive Constitutional:    weight loss, night sweats, fevers, chills, fatigue, lassitude. HEENT:    headaches, difficulty swallowing, tooth/dental problems, sore throat,       sneezing, itching, ear ache, +nasal congestion, post nasal drip, snoring CV:    chest pain, orthopnea, PND, swelling in lower extremities, anasarca,  dizziness, palpitations Resp:   shortness of breath with exertion or at rest.                productive cough,   non-productive cough, coughing up of blood.              change in color of mucus.  wheezing.   Skin:    rash or lesions. GI:  No-   heartburn, indigestion, abdominal pain, nausea, vomiting, diarrhea,                 change in bowel habits, loss of appetite GU: dysuria, change in color of urine, no urgency or frequency.   flank pain. MS:   joint pain, stiffness, decreased range of motion, back pain. Neuro-     nothing unusual Psych:  change in mood or affect.  depression or anxiety.   memory loss.  OBJ- Physical  Exam  +overweight General- Alert, Oriented, Affect-appropriate, Distress- none acute Skin- rash-none, lesions- none, excoriation- none Lymphadenopathy- none Head- atraumatic            Eyes- Gross vision intact, PERRLA, conjunctivae and secretions clear            Ears- Hearing, canals-normal            Nose- Clear, no-Septal dev, mucus, polyps, erosion, perforation             Throat- Mallampati IV , mucosa clear , drainage- none, tonsils- atrophic, +teeth Neck- flexible , trachea midline, no stridor , thyroid  nl, carotid no bruit Chest - symmetrical excursion , unlabored           Heart/CV- RRR , no murmur , no gallop   , no rub, nl s1 s2                           - JVD- none , edema- none, stasis changes- none, varices- none           Lung- clear to P&A, wheeze- none, cough- none , dullness-none, rub- none           Chest wall-  Abd-  Br/ Gen/ Rectal- Not done, not indicated Extrem- cyanosis- none, clubbing, none, atrophy- none, strength- nl Neuro- grossly intact to observation  Assessment and Plan    Obstructive Sleep Apnea Patient reports initial difficulty with CPAP machine, feeling like he was suffocating at the start of each night. This has improved over time and he now feels like he is breathing normally. Apnea-Hypopnea Index (AHI) is less than 2, indicating good control of sleep apnea with current settings. -Continue current CPAP settings. -Consider adjusting ramp feature if initial difficulty with breathing returns.  Dry Mouth Patient reports occasional dry mouth and throat during the night, requiring him to drink water or juice. This may be due to the CPAP machine. -Adjust humidifier settings on CPAP machine as needed to increase humidity and alleviate dry mouth. Instructions are in the machine's booklet or patient can call [facility] for assistance. -If dry mouth persists, consider adding a room humidifier.  General Health Patient reports a recent cold with some residual congestion, but otherwise no significant changes in health. -Continue current health maintenance practices.

## 2023-03-02 ENCOUNTER — Encounter: Payer: Self-pay | Admitting: Internal Medicine

## 2023-03-02 ENCOUNTER — Ambulatory Visit (INDEPENDENT_AMBULATORY_CARE_PROVIDER_SITE_OTHER): Payer: Medicare HMO | Admitting: Internal Medicine

## 2023-03-02 VITALS — BP 130/70 | HR 60 | Temp 98.2°F | Resp 18 | Ht 74.0 in | Wt 250.8 lb

## 2023-03-02 DIAGNOSIS — G4733 Obstructive sleep apnea (adult) (pediatric): Secondary | ICD-10-CM

## 2023-03-02 NOTE — Patient Instructions (Signed)
 We can continue CPAP 5-20   Please let us  know if we can help

## 2023-03-05 DIAGNOSIS — G4733 Obstructive sleep apnea (adult) (pediatric): Secondary | ICD-10-CM | POA: Diagnosis not present

## 2023-03-05 DIAGNOSIS — C439 Malignant melanoma of skin, unspecified: Secondary | ICD-10-CM | POA: Diagnosis not present

## 2023-03-05 DIAGNOSIS — R17 Unspecified jaundice: Secondary | ICD-10-CM | POA: Diagnosis not present

## 2023-03-05 DIAGNOSIS — Z1330 Encounter for screening examination for mental health and behavioral disorders, unspecified: Secondary | ICD-10-CM | POA: Diagnosis not present

## 2023-03-05 DIAGNOSIS — R911 Solitary pulmonary nodule: Secondary | ICD-10-CM | POA: Diagnosis not present

## 2023-03-05 DIAGNOSIS — E782 Mixed hyperlipidemia: Secondary | ICD-10-CM | POA: Diagnosis not present

## 2023-03-05 DIAGNOSIS — R972 Elevated prostate specific antigen [PSA]: Secondary | ICD-10-CM | POA: Diagnosis not present

## 2023-03-05 DIAGNOSIS — C61 Malignant neoplasm of prostate: Secondary | ICD-10-CM | POA: Diagnosis not present

## 2023-03-06 ENCOUNTER — Other Ambulatory Visit: Payer: Medicare HMO

## 2023-03-06 ENCOUNTER — Ambulatory Visit
Admission: RE | Admit: 2023-03-06 | Discharge: 2023-03-06 | Disposition: A | Discharge status: Home / Self Care | Payer: Medicare HMO | Source: Ambulatory Visit | Attending: Urology | Admitting: Urology

## 2023-03-06 DIAGNOSIS — C61 Malignant neoplasm of prostate: Secondary | ICD-10-CM

## 2023-03-06 DIAGNOSIS — R972 Elevated prostate specific antigen [PSA]: Secondary | ICD-10-CM | POA: Diagnosis not present

## 2023-03-06 MED ORDER — GADOPICLENOL 0.5 MMOL/ML IV SOLN
10.0000 mL | Freq: Once | INTRAVENOUS | Status: AC | PRN
  Administered 2023-03-06: 10 mL via INTRAVENOUS

## 2023-03-15 NOTE — Telephone Encounter (Signed)
 Pt has had a recent visit since sleep study was done.

## 2023-03-16 DIAGNOSIS — G4733 Obstructive sleep apnea (adult) (pediatric): Secondary | ICD-10-CM | POA: Diagnosis not present

## 2023-03-19 DIAGNOSIS — M545 Low back pain, unspecified: Secondary | ICD-10-CM | POA: Diagnosis not present

## 2023-03-19 DIAGNOSIS — M25561 Pain in right knee: Secondary | ICD-10-CM | POA: Diagnosis not present

## 2023-03-19 DIAGNOSIS — M1711 Unilateral primary osteoarthritis, right knee: Secondary | ICD-10-CM | POA: Diagnosis not present

## 2023-03-19 DIAGNOSIS — M4726 Other spondylosis with radiculopathy, lumbar region: Secondary | ICD-10-CM | POA: Diagnosis not present

## 2023-03-19 DIAGNOSIS — G8929 Other chronic pain: Secondary | ICD-10-CM | POA: Diagnosis not present

## 2023-03-19 DIAGNOSIS — M5116 Intervertebral disc disorders with radiculopathy, lumbar region: Secondary | ICD-10-CM | POA: Diagnosis not present

## 2023-03-19 DIAGNOSIS — M1612 Unilateral primary osteoarthritis, left hip: Secondary | ICD-10-CM | POA: Diagnosis not present

## 2023-03-19 DIAGNOSIS — M4316 Spondylolisthesis, lumbar region: Secondary | ICD-10-CM | POA: Diagnosis not present

## 2023-03-19 DIAGNOSIS — M4317 Spondylolisthesis, lumbosacral region: Secondary | ICD-10-CM | POA: Diagnosis not present

## 2023-03-19 DIAGNOSIS — M25552 Pain in left hip: Secondary | ICD-10-CM | POA: Diagnosis not present

## 2023-04-06 ENCOUNTER — Telehealth: Payer: Self-pay | Admitting: Internal Medicine

## 2023-04-06 NOTE — Telephone Encounter (Signed)
 PT calling fro a CPAP FU appt (30-90 days for compliance) Dr. Jeannie Fend said we would see him in a year. Please call PT to advise when he needs to be seen again. His # is [phone]

## 2023-04-07 NOTE — Telephone Encounter (Signed)
 Called patient.  Left message on VM.  Per Dr. Sinclair Ship last OV note,    Return in about 1 year (around 03/01/2024).   Patient will need to call DME [name] to find out if he needs a 30-90 days OV for compliance check.

## 2023-04-08 NOTE — Telephone Encounter (Signed)
 Patient would like a call back at the following number : [phone]

## 2023-04-09 DIAGNOSIS — M1711 Unilateral primary osteoarthritis, right knee: Secondary | ICD-10-CM | POA: Diagnosis not present

## 2023-04-09 DIAGNOSIS — S83241A Other tear of medial meniscus, current injury, right knee, initial encounter: Secondary | ICD-10-CM | POA: Diagnosis not present

## 2023-04-09 NOTE — Telephone Encounter (Addendum)
 Called [facility].  Spoke with [employer].  Patient will need a 30-90 day compliance check to meet insurance guidelines.  Patient was set up on 02/13/2023.  Called patient.  Made OV for tomorrow 04/10/2023 at 11:30am for CPAP compliance check with Dr. Maple Hudson.  Dr. Maple Hudson informed.

## 2023-04-09 NOTE — Progress Notes (Signed)
 12/01/22- 72 yoM never smoker (chews) for sleep evaluation courtesy of Kirstin Shepperson, PA-C with concern of OSA on CPAP Medical problem list includes Pulmonary Nodule, hx Kidney Stone, Hyperlipidemia NPSG 07/29/2007- AHI 10.1/hr, desat to 84%, body weight 230 lbs Epworth score- 14 Body weight today 253 lbs CPAP-he has been using his original machine every night.  Nasal pillows mask with totally worn out headgear.  He thinks setting is 12.  He has bought supplies from drugstore when needed and has not had connection with the DME company but he remembers.  We talked about alternatives send he works a lot in the [place] area, suggesting [employer].  He depends on CPAP and does not think he can sleep without it. He denies ENT surgery, heart or lung disease.  Does not use sleep medicines.  1 or 2 cups of morning coffee.  03/02/23- 72 yoM never smoker followed for OSA, complicated by Pulmonary Nodule, hx Kidney Stone, Hyperlipidemia HST 12/12/22- AHI 14/hr, desat to 88%, body weight 230 lbs Body weight today-250 lbs CPAP auto 5-20/ [facility] (previously [name])  replacement order 12/01/22 Download compliance-47%, AHI 1.5/hr    restarted Jan 24, used every night since. Discussed the use of AI scribe software for clinical note transcription with the patient, who gave verbal consent to proceed.  History of Present Illness   The patient, with a history of sleep apnea, presents for follow-up after receiving a new CPAP machine two weeks ago. Initially, he experienced a sensation of suffocation and struggled to breathe when the machine first started each night. However, this has improved significantly over the last several nights, and he now feels like he is breathing normally almost immediately. He is satisfied with the new mask, which fits well and stays in place throughout the night. We discussed ability to adjust Ramp and Humidifier if needed.  Occasionally, he wakes up in the middle  of the night with a dry mouth and throat, which is relieved by drinking water or orange juice. He has not adjusted the humidifier settings on the machine yet.  In addition to his sleep apnea, the patient recently had a cold, which caused some congestion but did not significantly impact his sleep or CPAP use.   Assessment and Plan:    Obstructive Sleep Apnea Patient reports initial difficulty with CPAP machine, feeling like he was suffocating at the start of each night. This has improved over time and he now feels like he is breathing normally. Apnea-Hypopnea Index (AHI) is less than 2, indicating good control of sleep apnea with current settings. -Continue current CPAP settings. -Consider adjusting ramp feature if initial difficulty with breathing returns.  Dry Mouth Patient reports occasional dry mouth and throat during the night, requiring him to drink water or juice. This may be due to the CPAP machine. -Adjust humidifier settings on CPAP machine as needed to increase humidity and alleviate dry mouth. Instructions are in the machine's booklet or patient can call [facility] for assistance. -If dry mouth persists, consider adding a room humidifier.  General Health Patient reports a recent cold with some residual congestion, but otherwise no significant changes in health. -Continue current health maintenance practices.     04/10/23- 72 yoM never smoker followed for OSA, complicated by Pulmonary Nodule, hx Kidney Stone, Hyperlipidemia HST 12/12/22- AHI 14/hr, desat to 88%, body weight 230 lbs CPAP auto 5-20/ [facility] (previously [name])  replacement order 12/01/22 Download compliance-100%, AHI 1.8/hr Body weight today-249 lbs Discussed the use of AI scribe  software for clinical note transcription with the patient, who gave verbal consent to proceed. History of Present Illness   The patient, with a history of sleep apnea, presents for a routine check-up. He reports using his CPAP  machine every night, with settings ranging between five and twenty centimeters of water pressure. The machine spends most of its time in the ten to sixteen range, mostly around fourteen, providing excellent control of his sleep apnea. The patient reports comfort with the CPAP machine and mask, which fits over the nose. However, he expresses a desire for longer sleep duration, attributing his current sleep patterns to late nights and early mornings due to work and gym commitments. He does not currently take any sleep aids.     ROS-see HPI   + = positive Constitutional:    weight loss, night sweats, fevers, chills, fatigue, lassitude. HEENT:    headaches, difficulty swallowing, tooth/dental problems, sore throat,       sneezing, itching, ear ache, +nasal congestion, post nasal drip, snoring CV:    chest pain, orthopnea, PND, swelling in lower extremities, anasarca,  dizziness, palpitations Resp:   shortness of breath with exertion or at rest.                productive cough,   non-productive cough, coughing up of blood.              change in color of mucus.  wheezing.   Skin:    rash or lesions. GI:  No-   heartburn, indigestion, abdominal pain, nausea, vomiting, diarrhea,                 change in bowel habits, loss of appetite GU: dysuria, change in color of urine, no urgency or frequency.   flank pain. MS:   joint pain, stiffness, decreased range of motion, back pain. Neuro-     nothing unusual Psych:  change in mood or affect.  depression or anxiety.   memory loss.  OBJ- Physical Exam  +overweight General- Alert, Oriented, Affect-appropriate, Distress- none acute Skin- rash-none, lesions- none, excoriation- none Lymphadenopathy- none Head- atraumatic            Eyes- Gross vision intact, PERRLA, conjunctivae and secretions clear            Ears- Hearing, canals-normal            Nose- Clear, no-Septal dev, mucus, polyps, erosion, perforation             Throat- Mallampati IV , mucosa  clear , drainage- none, tonsils- atrophic, +teeth Neck- flexible , trachea midline, no stridor , thyroid nl, carotid no bruit Chest - symmetrical excursion , unlabored           Heart/CV- RRR , no murmur , no gallop  , no rub, nl s1 s2                           - JVD- none , edema- none, stasis changes- none, varices- none           Lung- clear to P&A, wheeze- none, cough- none , dullness-none, rub- none           Chest wall-  Abd-  Br/ Gen/ Rectal- Not done, not indicated Extrem- cyanosis- none, clubbing, none, atrophy- none, strength- nl Neuro- grossly intact to observation  Assessment and Plan:    Obstructive Sleep Apnea CPAP therapy effectively controls apneas with AHI below 5. He  is comfortable with the nasal mask. - Continue current CPAP settings and usage. - Encourage maintaining a consistent sleep schedule.  Insomnia Difficulty with sleep duration possibly due to sleep schedule. Discussed occasional use of over-the-counter antihistamine-based sleep aids and risks of long-term use. - Consider occasional use of over-the-counter sleep aids like Zzquil or Sominex. - Avoid long-term use of antihistamine-based sleep aids. - Consider Tylenol PM for minimal aches and pains if needed.

## 2023-04-10 ENCOUNTER — Ambulatory Visit: Admitting: Internal Medicine

## 2023-04-10 ENCOUNTER — Encounter: Payer: Self-pay | Admitting: Internal Medicine

## 2023-04-10 VITALS — BP 128/64 | HR 55 | Temp 98.0°F | Ht 74.0 in | Wt 249.8 lb

## 2023-04-10 DIAGNOSIS — L570 Actinic keratosis: Secondary | ICD-10-CM | POA: Diagnosis not present

## 2023-04-10 DIAGNOSIS — R682 Dry mouth, unspecified: Secondary | ICD-10-CM

## 2023-04-10 DIAGNOSIS — G4733 Obstructive sleep apnea (adult) (pediatric): Secondary | ICD-10-CM

## 2023-04-10 DIAGNOSIS — L820 Inflamed seborrheic keratosis: Secondary | ICD-10-CM | POA: Diagnosis not present

## 2023-04-10 DIAGNOSIS — L821 Other seborrheic keratosis: Secondary | ICD-10-CM | POA: Diagnosis not present

## 2023-04-10 DIAGNOSIS — Z85820 Personal history of malignant melanoma of skin: Secondary | ICD-10-CM | POA: Diagnosis not present

## 2023-04-10 DIAGNOSIS — L918 Other hypertrophic disorders of the skin: Secondary | ICD-10-CM | POA: Diagnosis not present

## 2023-04-10 DIAGNOSIS — D2239 Melanocytic nevi of other parts of face: Secondary | ICD-10-CM | POA: Diagnosis not present

## 2023-04-10 DIAGNOSIS — Z85828 Personal history of other malignant neoplasm of skin: Secondary | ICD-10-CM | POA: Diagnosis not present

## 2023-04-10 DIAGNOSIS — D2362 Other benign neoplasm of skin of left upper limb, including shoulder: Secondary | ICD-10-CM | POA: Diagnosis not present

## 2023-04-10 NOTE — Patient Instructions (Signed)
 We can continue CPAP auto 5-20  Please call if we can help

## 2023-04-13 DIAGNOSIS — G4733 Obstructive sleep apnea (adult) (pediatric): Secondary | ICD-10-CM | POA: Diagnosis not present

## 2023-04-15 DIAGNOSIS — G4733 Obstructive sleep apnea (adult) (pediatric): Secondary | ICD-10-CM | POA: Diagnosis not present

## 2023-04-16 ENCOUNTER — Encounter: Payer: Self-pay | Admitting: Internal Medicine

## 2023-04-16 ENCOUNTER — Ambulatory Visit: Payer: Medicare HMO | Admitting: Nurse Practitioner

## 2023-04-16 ENCOUNTER — Encounter: Payer: Self-pay | Admitting: Nurse Practitioner

## 2023-04-16 ENCOUNTER — Other Ambulatory Visit (INDEPENDENT_AMBULATORY_CARE_PROVIDER_SITE_OTHER)

## 2023-04-16 DIAGNOSIS — R0789 Other chest pain: Secondary | ICD-10-CM

## 2023-04-16 DIAGNOSIS — Z860101 Personal history of adenomatous and serrated colon polyps: Secondary | ICD-10-CM | POA: Diagnosis not present

## 2023-04-16 LAB — HEPATIC FUNCTION PANEL
ALT: 19 U/L (ref 0–53)
AST: 18 U/L (ref 0–37)
Albumin: 4.3 g/dL (ref 3.5–5.2)
Alkaline Phosphatase: 71 U/L (ref 39–117)
Bilirubin, Direct: 0.2 mg/dL (ref 0.0–0.3)
Total Bilirubin: 1.6 mg/dL — ABNORMAL HIGH (ref 0.2–1.2)
Total Protein: 6.8 g/dL (ref 6.0–8.3)

## 2023-04-16 NOTE — Patient Instructions (Signed)
 You will be contacted by [facility] Sched[facility] in the next 2 days to arrange a Ultrasound.  The number on your caller ID will be [phone], please answer when they call.  If you have not heard from them in 2 days please call [phone] to schedule.     Next colonoscopy due 02/2025, We will mail you a recall letter when it comes time  Your provider has requested that you go to the basement level for lab work before leaving today. Press "B" on the elevator. The lab is located at the first door on the left as you exit the elevator.   Due to recent changes in healthcare laws, you may see the results of your imaging and laboratory studies on MyChart before your provider has had a chance to review them.  We understand that in some cases there may be results that are confusing or concerning to you. Not all laboratory results come back in the same time frame and the provider may be waiting for multiple results in order to interpret others.  Please give [place] 48 hours in order for your provider to thoroughly review all the results before contacting the office for clarification of your results.    I appreciate the  opportunity to care for you  Thank You   Emily Filbert

## 2023-04-16 NOTE — Progress Notes (Addendum)
 04/16/2023 Dennis Villanueva [id number] 1951/01/01   CHIEF COMPLAINT: Elevated total bilirubin levels   HISTORY OF PRESENT ILLNESS: Dennis Villanueva is a 73 year old male with a past medical history of kidney stones s/p lithortripsy, OSA uses CPAP, prostate cancer and colon polyps. He is known by Dr. Leone Payor.   He presents to our office today as referred by Dr. Farris Has for further evaluation regarding hyperbilirubinemia.  He endorses having elevated bilirubin levels for 40+ years.   Labs 02/24/2023: Total bili 2.1, Alk phos 76.  AST 24.  ALT 19. Labs 08/26/2022: Total bili 1.5.  Alk phos 87.  AST 16.  ALT 17. Labs 08/26/2021: Total bili 1.6.  Alk phos 82.  AST 23.  ALT 24. Labs 02/28/2021: Total bili 1.0.  Alk phos 74.  AST 17.  ALT 18. Labs 02/20/2014: Total bili 2.     Alk phos 72.  AST 15.  ALT 19. Labs 02/07/2013: Total bili 1.5.  Alk phos 68.  AST 17.  ALT 21.  Noncontrast renal stone CT 05/2014 showed a small low-density lesion in the lateral segment of the left liver lobe, likely a cyst.  No personal history or family history of liver disease.  He denies having any nausea or vomiting.  No upper or lower abdominal pain.  He passes a normal brown formed stool most days.  No bloody or black stools.  He underwent a colonoscopy by Dr. Leone Payor 02/25/2018 which identified 1 diminutive tubular adenomatous polyp removed from the ascending colon and diverticulosis to the sigmoid colon.  He was advised to repeat colonoscopy in 7 years.  He noted having discomfort to the left lower chest area if he drinks 3 to 4 cups of coffee and is asymptomatic if he drinks 1 to 2 cups of coffee daily.  Otherwise, no chest pain.  No palpitations or shortness of breath.  He takes Excedrin Migraine as needed for sciatica pain.  Colonoscopy  02/25/2018: - One diminutive polyp in the ascending colon, removed with a cold snare. Resected and retrieved.  - Diverticulosis in the sigmoid colon.  - The  examination was otherwise normal on direct and retroflexion views.  - Personal history of colonic polyp - diminutive adenoma removed 2004, no polyps 2009. - Recall colonoscopy 7 years - TUBULAR ADENOMA - NEGATIVE FOR HIGH-GRADE DYSPLASIA OR MALIGNANCY  Past Medical History:  Diagnosis Date   Allergic rhinitis    Cough    Fatigue    History of kidney stones    Hyperlipidemia    Kidney stone    OSA (obstructive sleep apnea)    wears cpap   Personal history of colonic adenoma 09/22/2012   Sleep apnea    CPAP   Past Surgical History:  Procedure Laterality Date   APPENDECTOMY     COLONOSCOPY  10/19/2007   EXTRACORPOREAL SHOCK WAVE LITHOTRIPSY Left 09/28/2017   Procedure: LEFT EXTRACORPOREAL SHOCK WAVE LITHOTRIPSY (ESWL);  Surgeon: Bjorn Pippin, MD;  Location: [facility];  Service: Urology;  Laterality: Left;   HAND SURGERY     Right   LITHOTRIPSY     Social History:  He is married. He has one daughter. He works in [employer]. He reports that he has never smoked. His smokeless tobacco use includes chew. He reports current alcohol use of about 3.0 standard drinks of alcohol per week. He reports that he does not use drugs.  Family History: Father with heart disease, emphysema and bladder cancer mother with asthma  and heart disease.  No known family history of esophageal, gastric or colon cancer.  No known family history of liver disease.  No Known Allergies    Outpatient Encounter Medications as of 04/16/2023  Medication Sig   aspirin 81 MG tablet Take 81 mg by mouth daily.   aspirin-acetaminophen-caffeine (EXCEDRIN MIGRAINE) 250-250-65 MG per tablet Take 1 tablet by mouth every 6 (six) hours as needed for headache.   latanoprost (XALATAN) 0.005 % ophthalmic solution Place 1 drop into both eyes at bedtime.   Multiple Vitamin (MULTIVITAMIN) tablet Take 1 tablet by mouth daily.   omega-3 acid ethyl esters (LOVAZA) 1 G capsule Take 4 capsules by mouth daily.    pravastatin (PRAVACHOL) 10 MG tablet  Take 10 mg by mouth daily.   Red Yeast Rice Extract (RED YEAST RICE PO) Take 1 tablet by mouth daily.   tamsulosin (FLOMAX) 0.4 MG CAPS capsule Take 0.4 mg by mouth.   Facility-Administered Encounter Medications as of 04/16/2023  Medication   0.9 %  sodium chloride infusion   REVIEW OF SYSTEMS:  Gen: Denies fever, sweats or chills. No weight loss.  CV: See HPI. Resp: Denies cough, shortness of breath of hemoptysis.  GI: See HPI. GU: Denies urinary burning, blood in urine, increased urinary frequency or incontinence. MS: Denies joint pain, muscles aches or weakness. Derm: Denies rash, itchiness, skin lesions or unhealing ulcers. Psych: Denies depression, anxiety, memory loss or confusion. Heme: Denies bruising, easy bleeding. Neuro:  Denies headaches, dizziness or paresthesias. Endo:  Denies any problems with DM, thyroid or adrenal function.  PHYSICAL EXAM: BP 134/70   Pulse (!) 58   Ht 6\' 2"  (1.88 m)   Wt 249 lb 8 oz (113.2 kg)   BMI 32.03 kg/m   General: 73 year old male in no acute distress. Head: Normocephalic and atraumatic. Eyes:  Sclerae non-icteric, conjunctive pink. Ears: Normal auditory acuity. Mouth: Dentition intact. No ulcers or lesions.  Neck: Supple, no lymphadenopathy or thyromegaly.  Lungs: Clear bilaterally to auscultation without wheezes, crackles or rhonchi. Heart: Regular rate and rhythm. Very soft systolic murmur. No rub or gallop appreciated.  Abdomen: Soft, nontender, nondistended. No masses. No hepatosplenomegaly. Normoactive bowel sounds x 4 quadrants.  Rectal: Deferred.  Musculoskeletal: Symmetrical with no gross deformities. Skin: Warm and dry. No rash or lesions on visible extremities. Extremities: No edema. Neurological: Alert oriented x 4, no focal deficits.  Psychological:  Alert and cooperative. Normal mood and affect.  ASSESSMENT AND PLAN:  73 year old male with elevated T. Bilirubin levels with normal Alk phos, AST and ALT levels.  Patient endorses having hyperbilirubinemia x 40+ years.  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  Patient endorses having hyperbilirubinemia x 40+ years. -Hepatic panel to check direct and indirect bilirubin levels  -RUQ sonogram to evaluate the liver and biliary tract  -Further recommendations to be determined after the above evaluation completed  History of colon polyps.  Colonoscopy 02/2018 identified 1 diminutive tubular adenomatous polyp removed from the ascending colon. -Next colonoscopy due 02/2025  Left lower chest discomfort which occurs only if he drinks 3 to 4 cups of coffee -Advised patient to follow-up with his PCP and to undergo cardiac evaluation if he has any further left chest pain -Avoid drinking more than 1 to 2 cups of coffee daily    CC:  Farris Has, MD  Primary gastroenterologist:  Lab Results  Component Value Date   ALT 19 04/16/2023   AST 18 04/16/2023   ALKPHOS 71 04/16/2023   BILITOT 1.6 (H) 04/16/2023   Labs prove Gilbert's as above - bili largely indirect. This is benign and needs no f/u  We can cancel [place].  Iva Boop, MD, [facility] '

## 2023-04-20 ENCOUNTER — Telehealth: Payer: Self-pay | Admitting: Nurse Practitioner

## 2023-04-20 NOTE — Telephone Encounter (Signed)
 I called Mr. Dennis Villanueva and spoke to him at this time and I discussed his labs were consistent with Gilbert's syndrome and no further liver evaluation was required.  Refer to lab notes 04/16/2023. Previously ordered RUQ sonogram canceled as recommended by Dr. Leone Payor.  Patient appreciated the call.  Louretta Shorten

## 2023-04-20 NOTE — Telephone Encounter (Signed)
 Attempted to reach patient. No answer, voicemail not set up. I have contacted radiology to cancel previously scheduled ultrasound for 04/23/23.

## 2023-04-20 NOTE — Telephone Encounter (Signed)
 Linda/Dottie, pls contact patient and let him know that his labs were consistent with Gilbert's syndrome ( refer to lab notes) and Dr. Leone Payor reviewed these labs as well and did not assess the patient needed to proceed with the scheduled abdominal sonogram. Please let patient know and cancel his abd sonogram. THX.

## 2023-04-22 ENCOUNTER — Encounter: Payer: Self-pay | Admitting: Internal Medicine

## 2023-04-23 ENCOUNTER — Ambulatory Visit (HOSPITAL_COMMUNITY)

## 2023-05-14 DIAGNOSIS — G4733 Obstructive sleep apnea (adult) (pediatric): Secondary | ICD-10-CM | POA: Diagnosis not present

## 2023-06-13 DIAGNOSIS — G4733 Obstructive sleep apnea (adult) (pediatric): Secondary | ICD-10-CM | POA: Diagnosis not present

## 2023-07-14 DIAGNOSIS — G4733 Obstructive sleep apnea (adult) (pediatric): Secondary | ICD-10-CM | POA: Diagnosis not present

## 2023-08-13 DIAGNOSIS — G4733 Obstructive sleep apnea (adult) (pediatric): Secondary | ICD-10-CM | POA: Diagnosis not present

## 2023-08-17 DIAGNOSIS — G4733 Obstructive sleep apnea (adult) (pediatric): Secondary | ICD-10-CM | POA: Diagnosis not present

## 2023-09-02 DIAGNOSIS — H40053 Ocular hypertension, bilateral: Secondary | ICD-10-CM | POA: Diagnosis not present

## 2023-09-08 DIAGNOSIS — R17 Unspecified jaundice: Secondary | ICD-10-CM | POA: Diagnosis not present

## 2023-09-08 DIAGNOSIS — E782 Mixed hyperlipidemia: Secondary | ICD-10-CM | POA: Diagnosis not present

## 2023-09-08 DIAGNOSIS — R972 Elevated prostate specific antigen [PSA]: Secondary | ICD-10-CM | POA: Diagnosis not present

## 2023-09-08 DIAGNOSIS — Z1329 Encounter for screening for other suspected endocrine disorder: Secondary | ICD-10-CM | POA: Diagnosis not present

## 2023-09-09 DIAGNOSIS — C61 Malignant neoplasm of prostate: Secondary | ICD-10-CM | POA: Diagnosis not present

## 2023-09-13 DIAGNOSIS — G4733 Obstructive sleep apnea (adult) (pediatric): Secondary | ICD-10-CM | POA: Diagnosis not present

## 2023-09-14 DIAGNOSIS — C61 Malignant neoplasm of prostate: Secondary | ICD-10-CM | POA: Diagnosis not present

## 2023-09-14 DIAGNOSIS — R972 Elevated prostate specific antigen [PSA]: Secondary | ICD-10-CM | POA: Diagnosis not present

## 2023-09-14 DIAGNOSIS — Z0000 Encounter for general adult medical examination without abnormal findings: Secondary | ICD-10-CM | POA: Diagnosis not present

## 2023-09-14 DIAGNOSIS — C439 Malignant melanoma of skin, unspecified: Secondary | ICD-10-CM | POA: Diagnosis not present

## 2023-09-14 DIAGNOSIS — R911 Solitary pulmonary nodule: Secondary | ICD-10-CM | POA: Diagnosis not present

## 2023-09-14 DIAGNOSIS — E782 Mixed hyperlipidemia: Secondary | ICD-10-CM | POA: Diagnosis not present

## 2023-09-14 DIAGNOSIS — I700 Atherosclerosis of aorta: Secondary | ICD-10-CM | POA: Diagnosis not present

## 2023-09-14 DIAGNOSIS — Z8589 Personal history of malignant neoplasm of other organs and systems: Secondary | ICD-10-CM | POA: Diagnosis not present

## 2023-09-14 DIAGNOSIS — Z85828 Personal history of other malignant neoplasm of skin: Secondary | ICD-10-CM | POA: Diagnosis not present

## 2023-09-14 DIAGNOSIS — G4733 Obstructive sleep apnea (adult) (pediatric): Secondary | ICD-10-CM | POA: Diagnosis not present

## 2023-09-23 ENCOUNTER — Other Ambulatory Visit: Payer: Self-pay | Admitting: Urology

## 2023-09-23 ENCOUNTER — Encounter: Payer: Self-pay | Admitting: Urology

## 2023-09-23 DIAGNOSIS — C61 Malignant neoplasm of prostate: Secondary | ICD-10-CM

## 2023-09-23 DIAGNOSIS — R972 Elevated prostate specific antigen [PSA]: Secondary | ICD-10-CM

## 2023-10-06 ENCOUNTER — Other Ambulatory Visit

## 2023-10-14 DIAGNOSIS — G4733 Obstructive sleep apnea (adult) (pediatric): Secondary | ICD-10-CM | POA: Diagnosis not present

## 2023-10-26 DIAGNOSIS — I700 Atherosclerosis of aorta: Secondary | ICD-10-CM | POA: Diagnosis not present

## 2023-10-26 DIAGNOSIS — M25561 Pain in right knee: Secondary | ICD-10-CM | POA: Diagnosis not present

## 2023-10-26 DIAGNOSIS — Z7184 Encounter for health counseling related to travel: Secondary | ICD-10-CM | POA: Diagnosis not present

## 2023-10-26 DIAGNOSIS — M1711 Unilateral primary osteoarthritis, right knee: Secondary | ICD-10-CM | POA: Diagnosis not present

## 2023-10-26 DIAGNOSIS — E782 Mixed hyperlipidemia: Secondary | ICD-10-CM | POA: Diagnosis not present

## 2023-10-26 DIAGNOSIS — C61 Malignant neoplasm of prostate: Secondary | ICD-10-CM | POA: Diagnosis not present

## 2023-11-02 ENCOUNTER — Ambulatory Visit
Admission: RE | Admit: 2023-11-02 | Discharge: 2023-11-02 | Disposition: A | Source: Ambulatory Visit | Attending: Urology | Admitting: Urology

## 2023-11-02 DIAGNOSIS — R972 Elevated prostate specific antigen [PSA]: Secondary | ICD-10-CM | POA: Diagnosis not present

## 2023-11-02 DIAGNOSIS — C61 Malignant neoplasm of prostate: Secondary | ICD-10-CM

## 2023-11-02 MED ORDER — GADOPICLENOL 0.5 MMOL/ML IV SOLN
10.0000 mL | Freq: Once | INTRAVENOUS | Status: AC | PRN
Start: 2023-11-02 — End: 2023-11-02
  Administered 2023-11-02: 10 mL via INTRAVENOUS

## 2023-11-12 DIAGNOSIS — R269 Unspecified abnormalities of gait and mobility: Secondary | ICD-10-CM | POA: Diagnosis not present

## 2023-11-12 DIAGNOSIS — R531 Weakness: Secondary | ICD-10-CM | POA: Diagnosis not present

## 2023-11-12 DIAGNOSIS — M25561 Pain in right knee: Secondary | ICD-10-CM | POA: Diagnosis not present

## 2023-11-18 DIAGNOSIS — R531 Weakness: Secondary | ICD-10-CM | POA: Diagnosis not present

## 2023-11-18 DIAGNOSIS — M25561 Pain in right knee: Secondary | ICD-10-CM | POA: Diagnosis not present

## 2023-11-18 DIAGNOSIS — R269 Unspecified abnormalities of gait and mobility: Secondary | ICD-10-CM | POA: Diagnosis not present

## 2023-11-25 DIAGNOSIS — M1711 Unilateral primary osteoarthritis, right knee: Secondary | ICD-10-CM | POA: Diagnosis not present

## 2023-11-25 DIAGNOSIS — R531 Weakness: Secondary | ICD-10-CM | POA: Diagnosis not present

## 2023-11-25 DIAGNOSIS — M948X6 Other specified disorders of cartilage, lower leg: Secondary | ICD-10-CM | POA: Diagnosis not present

## 2023-11-25 DIAGNOSIS — R269 Unspecified abnormalities of gait and mobility: Secondary | ICD-10-CM | POA: Diagnosis not present

## 2023-11-25 DIAGNOSIS — M25561 Pain in right knee: Secondary | ICD-10-CM | POA: Diagnosis not present

## 2023-11-25 DIAGNOSIS — S83231A Complex tear of medial meniscus, current injury, right knee, initial encounter: Secondary | ICD-10-CM | POA: Diagnosis not present

## 2023-11-25 DIAGNOSIS — M7121 Synovial cyst of popliteal space [Baker], right knee: Secondary | ICD-10-CM | POA: Diagnosis not present

## 2023-11-25 DIAGNOSIS — R600 Localized edema: Secondary | ICD-10-CM | POA: Diagnosis not present

## 2023-11-30 DIAGNOSIS — Z0184 Encounter for antibody response examination: Secondary | ICD-10-CM | POA: Diagnosis not present

## 2023-12-02 DIAGNOSIS — M1711 Unilateral primary osteoarthritis, right knee: Secondary | ICD-10-CM | POA: Diagnosis not present

## 2023-12-02 DIAGNOSIS — M25561 Pain in right knee: Secondary | ICD-10-CM | POA: Diagnosis not present

## 2023-12-02 DIAGNOSIS — R531 Weakness: Secondary | ICD-10-CM | POA: Diagnosis not present

## 2023-12-02 DIAGNOSIS — R269 Unspecified abnormalities of gait and mobility: Secondary | ICD-10-CM | POA: Diagnosis not present

## 2023-12-09 DIAGNOSIS — R269 Unspecified abnormalities of gait and mobility: Secondary | ICD-10-CM | POA: Diagnosis not present

## 2023-12-09 DIAGNOSIS — R531 Weakness: Secondary | ICD-10-CM | POA: Diagnosis not present

## 2023-12-09 DIAGNOSIS — M25561 Pain in right knee: Secondary | ICD-10-CM | POA: Diagnosis not present

## 2023-12-10 DIAGNOSIS — C61 Malignant neoplasm of prostate: Secondary | ICD-10-CM | POA: Diagnosis not present

## 2023-12-10 DIAGNOSIS — I700 Atherosclerosis of aorta: Secondary | ICD-10-CM | POA: Diagnosis not present

## 2023-12-10 DIAGNOSIS — E782 Mixed hyperlipidemia: Secondary | ICD-10-CM | POA: Diagnosis not present

## 2023-12-16 DIAGNOSIS — M1711 Unilateral primary osteoarthritis, right knee: Secondary | ICD-10-CM | POA: Diagnosis not present

## 2023-12-21 DIAGNOSIS — M25561 Pain in right knee: Secondary | ICD-10-CM | POA: Diagnosis not present

## 2023-12-21 DIAGNOSIS — R531 Weakness: Secondary | ICD-10-CM | POA: Diagnosis not present

## 2023-12-21 DIAGNOSIS — R269 Unspecified abnormalities of gait and mobility: Secondary | ICD-10-CM | POA: Diagnosis not present

## 2023-12-23 DIAGNOSIS — M1711 Unilateral primary osteoarthritis, right knee: Secondary | ICD-10-CM | POA: Diagnosis not present

## 2023-12-25 DIAGNOSIS — E782 Mixed hyperlipidemia: Secondary | ICD-10-CM | POA: Diagnosis not present

## 2023-12-25 DIAGNOSIS — I700 Atherosclerosis of aorta: Secondary | ICD-10-CM | POA: Diagnosis not present

## 2023-12-25 DIAGNOSIS — C61 Malignant neoplasm of prostate: Secondary | ICD-10-CM | POA: Diagnosis not present

## 2023-12-29 DIAGNOSIS — R269 Unspecified abnormalities of gait and mobility: Secondary | ICD-10-CM | POA: Diagnosis not present

## 2023-12-29 DIAGNOSIS — M25561 Pain in right knee: Secondary | ICD-10-CM | POA: Diagnosis not present

## 2023-12-29 DIAGNOSIS — R531 Weakness: Secondary | ICD-10-CM | POA: Diagnosis not present

## 2023-12-30 DIAGNOSIS — M1711 Unilateral primary osteoarthritis, right knee: Secondary | ICD-10-CM | POA: Diagnosis not present

## 2024-01-22 ENCOUNTER — Telehealth: Payer: Self-pay

## 2024-01-22 ENCOUNTER — Encounter: Payer: Self-pay | Admitting: Internal Medicine

## 2024-01-22 NOTE — Telephone Encounter (Signed)
 Front staff, please see if you can have pt established and scheduled with a new MD.

## 2024-01-22 NOTE — Telephone Encounter (Signed)
 Patient scheduled.

## 2024-03-01 ENCOUNTER — Ambulatory Visit: Payer: Medicare HMO | Admitting: Internal Medicine

## 2024-04-11 ENCOUNTER — Ambulatory Visit: Admitting: Internal Medicine

## 2024-04-13 ENCOUNTER — Encounter: Admitting: Pulmonary Disease
